# Patient Record
Sex: Male | Born: 1950 | Race: White | Hispanic: No | Marital: Married | Smoking: Former smoker
Health system: Southern US, Community
[De-identification: ages and names within clinical notes are randomized; demographics above are authoritative.]

## PROBLEM LIST (undated history)

## (undated) DIAGNOSIS — M1990 Unspecified osteoarthritis, unspecified site: Secondary | ICD-10-CM

## (undated) DIAGNOSIS — Z9289 Personal history of other medical treatment: Secondary | ICD-10-CM

## (undated) DIAGNOSIS — M109 Gout, unspecified: Secondary | ICD-10-CM

## (undated) DIAGNOSIS — Z87442 Personal history of urinary calculi: Secondary | ICD-10-CM

## (undated) DIAGNOSIS — F419 Anxiety disorder, unspecified: Secondary | ICD-10-CM

## (undated) DIAGNOSIS — I10 Essential (primary) hypertension: Secondary | ICD-10-CM

## (undated) HISTORY — PX: TONSILLECTOMY: SUR1361

## (undated) HISTORY — PX: BACK SURGERY: SHX140

---

## 2004-08-05 DIAGNOSIS — Z9289 Personal history of other medical treatment: Secondary | ICD-10-CM

## 2004-08-05 HISTORY — DX: Personal history of other medical treatment: Z92.89

## 2006-06-21 ENCOUNTER — Encounter: Admission: RE | Admit: 2006-06-21 | Discharge: 2006-06-21 | Payer: Self-pay | Admitting: Neurosurgery

## 2006-08-13 ENCOUNTER — Ambulatory Visit (HOSPITAL_COMMUNITY): Admission: RE | Admit: 2006-08-13 | Discharge: 2006-08-14 | Payer: Self-pay | Admitting: Neurosurgery

## 2006-09-08 ENCOUNTER — Encounter: Admission: RE | Admit: 2006-09-08 | Discharge: 2006-10-23 | Payer: Self-pay | Admitting: Neurosurgery

## 2007-06-22 ENCOUNTER — Encounter: Admission: RE | Admit: 2007-06-22 | Discharge: 2007-06-22 | Payer: Self-pay | Admitting: Neurosurgery

## 2007-11-02 ENCOUNTER — Encounter: Admission: RE | Admit: 2007-11-02 | Discharge: 2007-11-02 | Payer: Self-pay | Admitting: Neurosurgery

## 2007-11-18 ENCOUNTER — Ambulatory Visit (HOSPITAL_COMMUNITY): Admission: RE | Admit: 2007-11-18 | Discharge: 2007-11-19 | Payer: Self-pay | Admitting: Neurosurgery

## 2008-04-04 ENCOUNTER — Encounter: Admission: RE | Admit: 2008-04-04 | Discharge: 2008-04-04 | Payer: Self-pay | Admitting: Neurosurgery

## 2008-05-03 ENCOUNTER — Encounter: Admission: RE | Admit: 2008-05-03 | Discharge: 2008-05-03 | Payer: Self-pay | Admitting: Neurosurgery

## 2008-06-15 ENCOUNTER — Encounter: Admission: RE | Admit: 2008-06-15 | Discharge: 2008-06-15 | Payer: Self-pay | Admitting: Neurosurgery

## 2008-07-05 ENCOUNTER — Encounter: Admission: RE | Admit: 2008-07-05 | Discharge: 2008-07-05 | Payer: Self-pay | Admitting: Neurosurgery

## 2008-07-22 ENCOUNTER — Inpatient Hospital Stay (HOSPITAL_COMMUNITY): Admission: RE | Admit: 2008-07-22 | Discharge: 2008-07-26 | Payer: Self-pay | Admitting: Neurosurgery

## 2008-08-23 ENCOUNTER — Encounter: Admission: RE | Admit: 2008-08-23 | Discharge: 2008-08-23 | Payer: Self-pay | Admitting: Neurosurgery

## 2008-11-24 ENCOUNTER — Encounter: Admission: RE | Admit: 2008-11-24 | Discharge: 2008-11-24 | Payer: Self-pay | Admitting: Neurosurgery

## 2009-02-28 ENCOUNTER — Encounter: Admission: RE | Admit: 2009-02-28 | Discharge: 2009-02-28 | Payer: Self-pay | Admitting: Neurosurgery

## 2009-03-01 IMAGING — RF DG LUMBAR SPINE 2-3V
1 series · 2 of 2 positions shown · non-contrast
Comparison: CT 05/03/2008

CLINICAL DATA: L2-S1 posterior fusion.

LUMBAR SPINE - 2-3 VIEW

[Series 1: run · 2 of 2 slices shown]
[im 1/2]
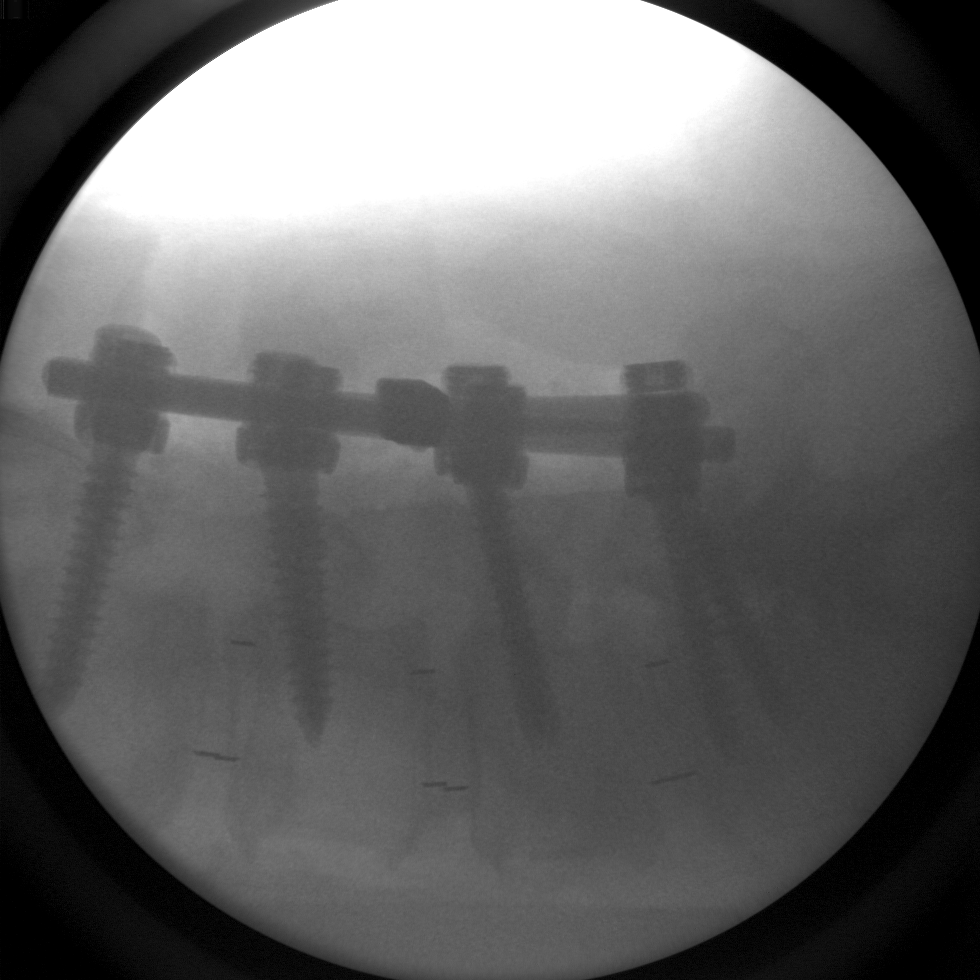
[im 2/2]
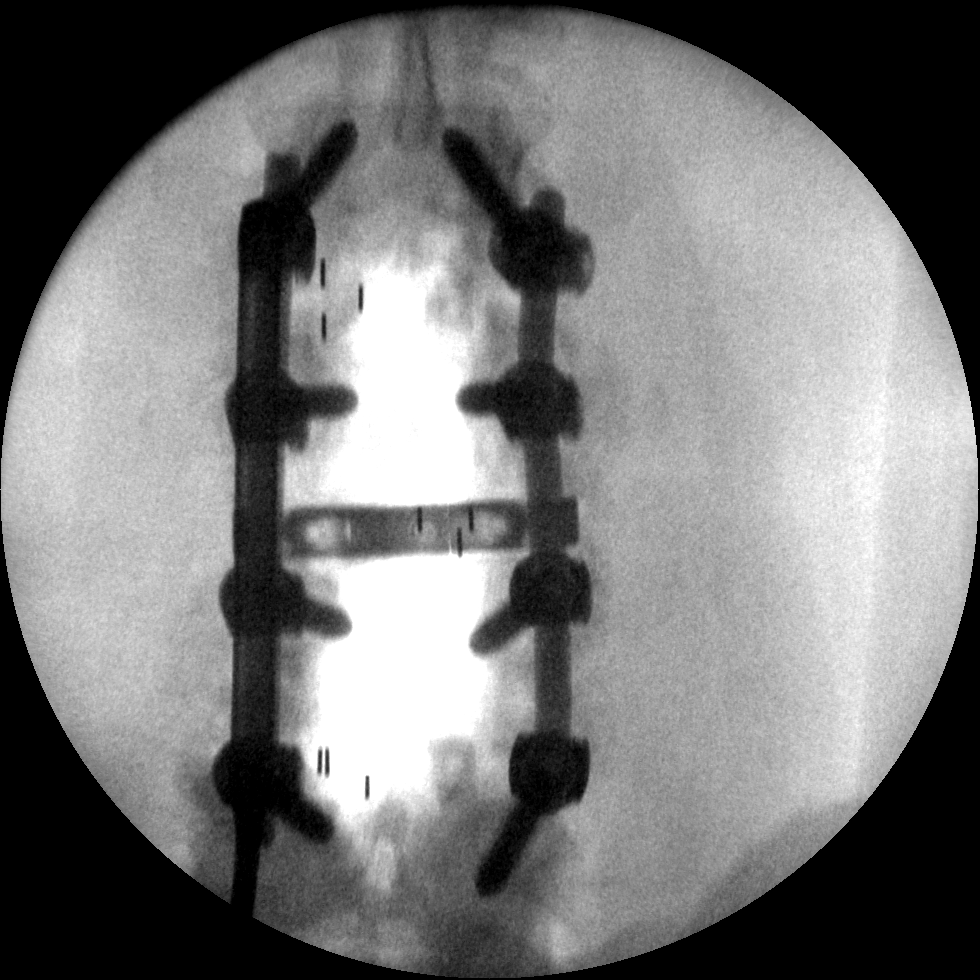

[2 of 2 positions shown; findings below may reference images not displayed]

FINDINGS: The patient is status post three-level posterior fusion
in the lumbar spine.  It is difficult to determine exact levels.
Pedicle screws appear to be an appropriate position.
IMPRESSION: Three-level posterior lumbar fusion.

## 2009-04-02 IMAGING — CR DG LUMBAR SPINE 2-3V
3 series · 3 of 3 positions shown · non-contrast
Comparison: [HOSPITAL] at [REDACTED] [HOSPITAL] lumbar spine
radiographs 05/03/2008 and [HOSPITAL] intraoperative
lumbar spine radiographs 07/22/2008.

CLINICAL DATA: Lumbar L2-L5 PLIF.  Low back pain.

LUMBAR SPINE - 2-3 VIEW

[view not recorded (1 of 3)]
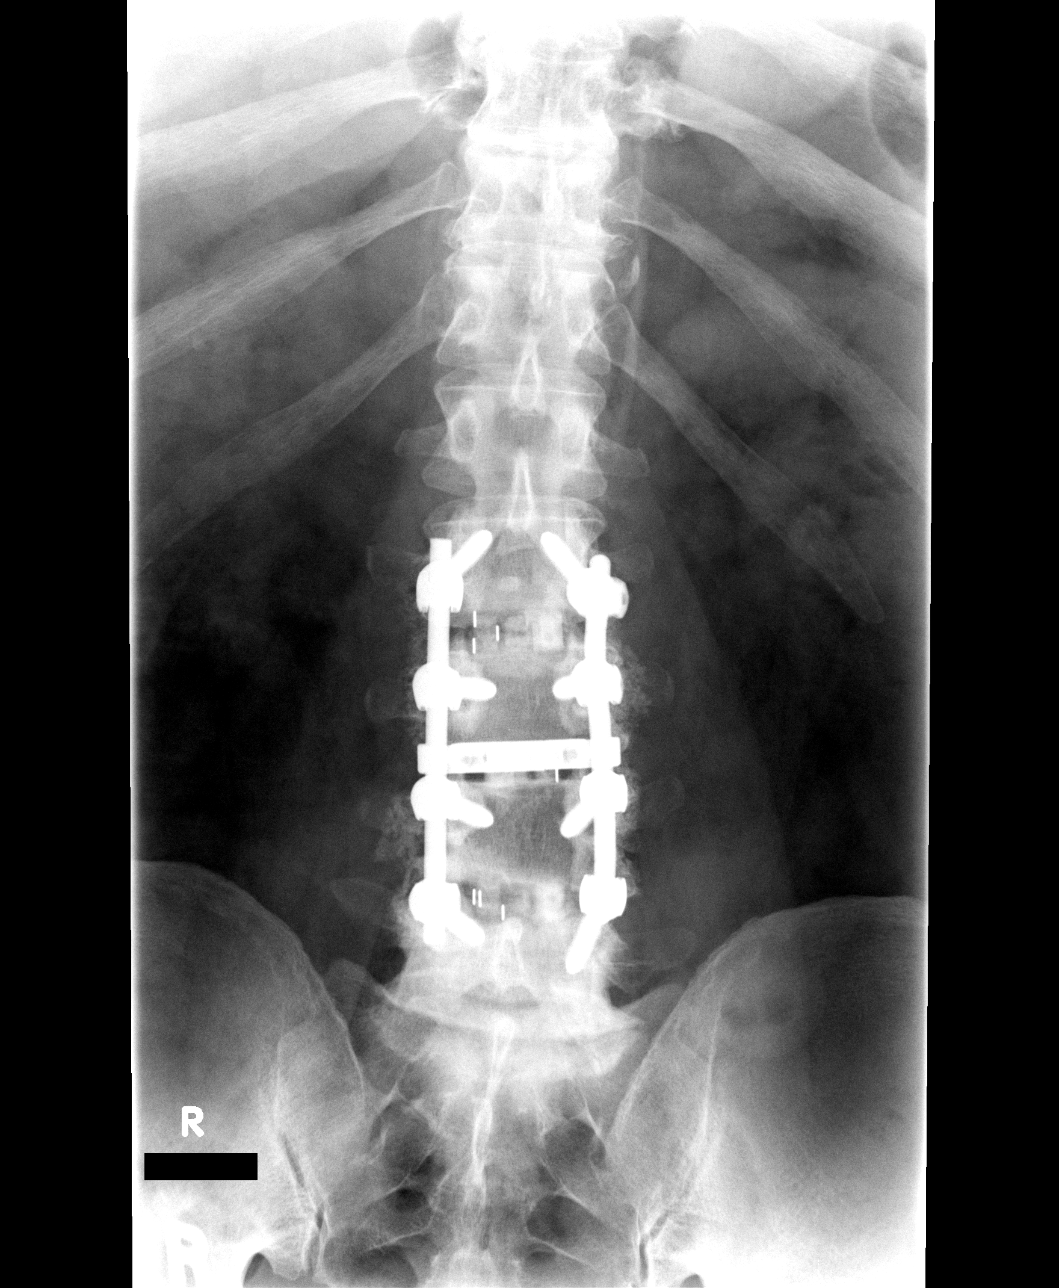

[view not recorded (2 of 3)]
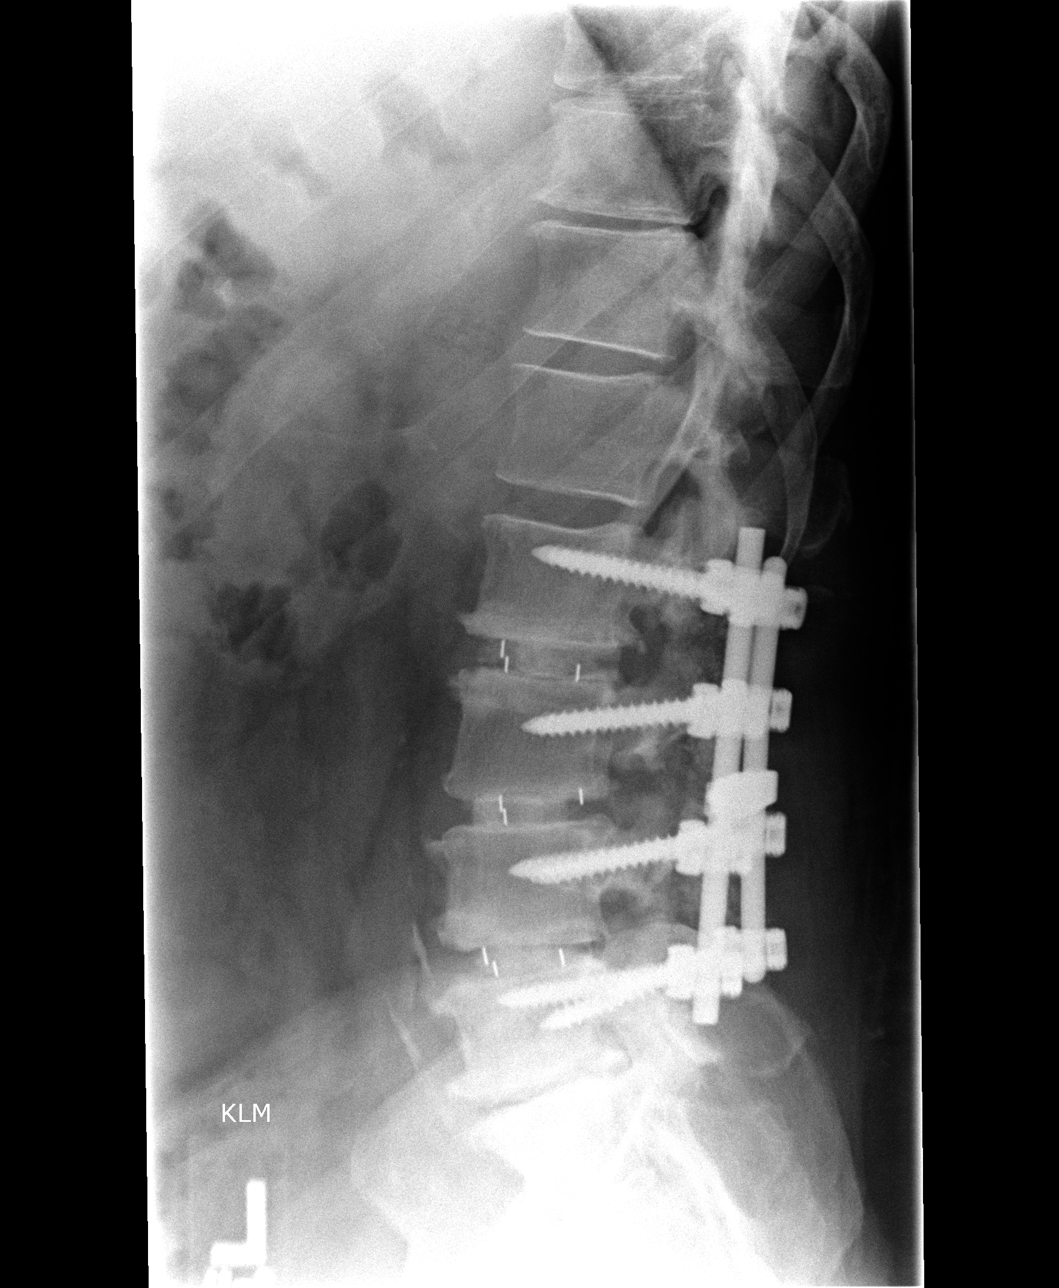

[view not recorded (3 of 3)]
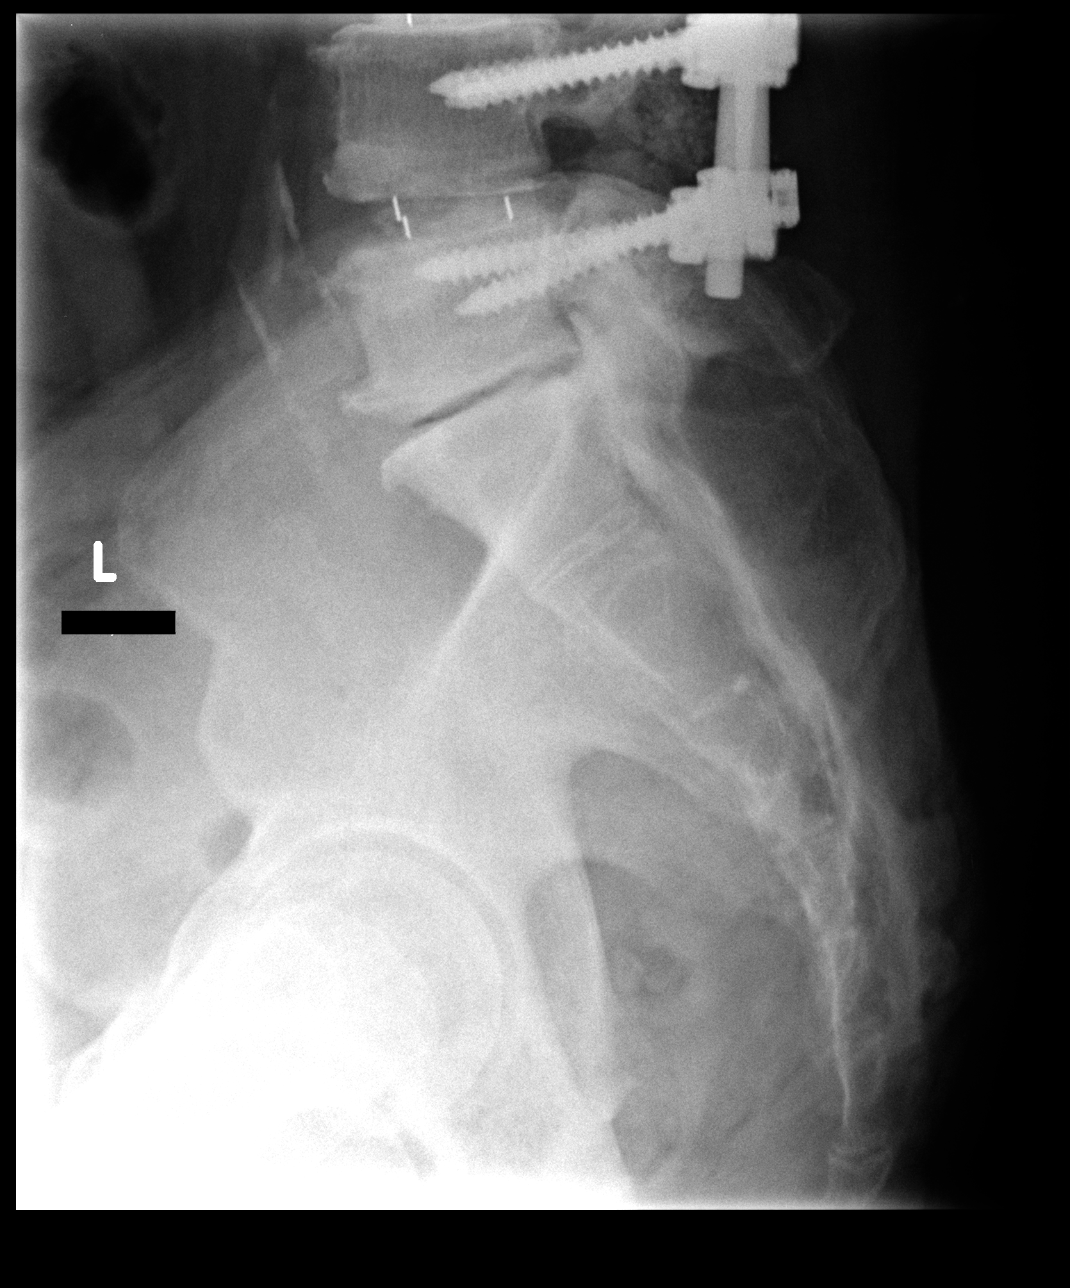

[3 of 3 positions shown; findings below may reference images not displayed]

FINDINGS: Stable posterior laminectomy interbody fusion hardware and
interbody bone plug since 07/22/2008 with satisfactory position.
The posterior vertebral alignment is normally maintained.  No
change in advanced degenerative disc disease with slight posterior
spondylosis maximal at L5-S1 and lesser posterior spondylosis L2-3.
Slight vascular calcification noted.  No acute findings seen.
IMPRESSION: 1.  No acute findings.
2.  Stable posterior laminectomy fusion L2-L5.
3.  Stable advanced degenerative disc disease with slight posterior
spondylosis maximal at L5-S1 and lesser posterior spondylosis L2-3.

## 2009-12-26 ENCOUNTER — Encounter: Admission: RE | Admit: 2009-12-26 | Discharge: 2009-12-26 | Payer: Self-pay | Admitting: Neurosurgery

## 2010-05-28 ENCOUNTER — Encounter
Admission: RE | Admit: 2010-05-28 | Discharge: 2010-07-31 | Payer: Self-pay | Source: Home / Self Care | Attending: Neurosurgery | Admitting: Neurosurgery

## 2010-12-18 NOTE — Op Note (Signed)
NAME:  Jason Kidd, Jason Kidd NO.:  [id number]   MEDICAL RECORD NO.:  [id number]          PATIENT TYPE:  OIB   LOCATION:  3599                         FACILITY:  [facility]   PHYSICIAN:  Donalee Citrin, M.D.        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   PROCEDURE:  1. Redo laminated microdiskectomy at L3-4.  2. Microscopic dissection of the right L4 nerve root.  3. Microscopic diskectomy.   SURGEON:  Donalee Citrin, MD   ASSISTANT:  Yetta Barre.   ANESTHESIA:  General endotracheal.   HISTORY OF PRESENT ILLNESS:  The patient is a very pleasant 56-year  gentleman who underwent decompressive laminectomies at 2 levels  approximately a year ago.  The patient did very well initially; however,  he said that over the last several weeks and months, progressive  worsening back and bilateral leg pain, predominantly in his hips and  legs, radiating down outside of his thighs to his knee, occasionally  just below his knee consistent with an L3-L4 nerve root pattern.  The  right leg initially was worse for him and progressed and became equally  involving the left leg.  His MRI scan showed recurrent disk herniation  encased in scar causing severe spinal stenosis on thecal sac at L3-4.  Flexion/extension films showed no sign of instability.  The patient had  predominantly claudication and leg pain.  So after failure of  conservative treatment, the patient was recommended a reexploration from  the right where the disk was eccentric to.  I went over redo diskectomy  and laminectomy.  The risks and benefits were explained to the patient  who understood and agreed to proceed forth.   The patient was brought to the OR, was induced under general anesthesia,  positioned prone  on Wilson frame.  The  back was prepped and draped in  the usual fashion.  X-ray used to  localize the appropriate level at the  superior aspect of the old incision.  The old incision was partially  opened up and ellipticized.  The scar tissue was dissected free and  subperiosteal dissection was carried over the lamina.  The residual  lamina of L3 medial facet complex down to the level of the  L3-L4  pedicle.  Intraoperative x-ray identification of the L4 pedicle.  Then  using a #4 Penfield and a #1 Penfield, the scar tissue was dissected off  of the medial border of the facet complex and the plane from the scar to  the medial facet was developed.  Then, the 2 mm Kerrison punch was used  to extend partial medial facetectomy at this level.  A high-speed drill  with a small matchstick drill bit was used to facilitate some additional  medial facetectomy, as well as the inferior aspect of the lamina.  The  residual lamina of L3 was further removed.  Then, the L4 pedicle was  identified.  There was an intensive scar tissue.  This was dissected off  the L4 pedicle and the L4 nerve root was identified and reflected  medially.  Then, attention was taken superiorly, working on extended  laminotomy up superiorly.  Native dura was found and then working from  above and below using a 4 Cytogeneticist.  The undersurface of the  dura was dissected off a very large glistening retained fragment that  was encased in scar.  It was densely adherent to the undersurface of the  dura.  It was teased away with a nerve hook and dissectors, and then  once it was confirmed that the nerve root and dura and thecal sac were  reflected medially, this was a large fragment.  This was fished out of  this compartment and the scar was dissected free and the disk space was  entered.  Annulotomy was extended and radically cleaned out.  At the end  of the diskectomy, there was no further stenosis on thecal sac or L4  nerve root.  A dilator  easily passed along the L4 foramen as well as  underneath the thecal sac all the way to the other side.  The thecal sac  was noted to be decompressed on both sides.  The undersurface of the  medial thecal sac and the left side were also easily palpated and noted  to be decompressed.  Then the wound was copiously irrigated.  Meticulous  hemostasis was maintained.  Gelfoam was laid on top of the dura.  The  muscle and fascia reapproximated in layers with interrupted Vicryl.  The  skin was closed with running 4-0 subcuticular.  Benzoin and Steri-Strips  applied.  The patient was then sent to recovery room in stable  condition.   At the end of the case, needle, instrument and sponge  counts correct.           ______________________________  Donalee Citrin, M.D.     GC/MEDQ  D:  11/18/2007  T:  11/19/2007  Job:  [id number]

## 2010-12-18 NOTE — Op Note (Signed)
NAME:  Jason Kidd, Jason Kidd NO.:  1   MEDICAL RECORD NO.:  [id number]          PATIENT TYPE:  INP   LOCATION:  3108                         FACILITY:  [facility]   PHYSICIAN:  Donalee Citrin, M.D.        DATE OF BIRTH:  08-05-1951   DATE OF PROCEDURE:  07/22/2008  DATE OF DISCHARGE:                               OPERATIVE REPORT   PREOPERATIVE DIAGNOSES:  1. Degenerative disk disease.  2. Degenerative lumbar scoliosis.  3. Lumbar spinal stenosis L2-3, L3-4, and L4-5.   PROCEDURE:  Redo decompressive lumbar laminectomies L3-4 and L4-5,  decompressive laminectomy L2-3, posterior lumbar interbody fusion L2-3,  L3-4, and L4-5 using a hybrid Telamon 8 x 22 mm PEEK allograft cage  packed with local autograft mixed with Actifuse and tangent 8 x 26 mm  allograft wedge, pedicle screw fixation L2 through L5 with 6.35 legacy  pedicle screw system, posterior arthrodesis L2-L5 using local autograft  packed with Actifuse, open reduction spinal deformity L2 through L5,  placement of a large Hemovac drain.   SURGEON:  Donalee Citrin, MD   ASSISTANT:  Reinaldo Meeker, MD   ANESTHESIA:  General endotracheal.   HISTORY OF PRESENT ILLNESS:  The patient is a very pleasant 60 year old  gentleman who has had progressive worsening back and predominant leg  pain going on for the last several months.  The patient had had previous  decompressive laminectomy several years ago.  Subsequently had a  recurrent disk herniation at L3-4, underwent redo laminectomy on that  for that and recurred again.  Over the course of the last several  months, the patient had progressive worsening neurogenic claudication  with pain that would radiate to both hips down to the front of both  quads and occasionally into the front of his shins.  The patient has had  some weakness in his quads and difficulty ambulating and pain has been  refractory to all forms of treatment with epidural steroid injections,  anti-inflammatories, narcotic pain medication.  Repeat imaging with CT  scan and lumbar MRI showed a large recurrent disk herniation at L3-4.  CT scan also showed severe degenerative scoliosis at L2-3, L3-4, and L4-  5.  Due to the patient's multiple recurrent herniations with severe  stenosis, neurogenic claudication, and previous surgery, the patient was  recommended decompression stabilization procedure and due to this  degenerative scoliosis and severe foraminal collapse on the right, the  patient was recommended L2-3, L3-4, and L4-5 decompression stabilization  with exploration of L5-S1.  Risks and benefits of the operation were  explained to the patient.  He understood and agreed to proceed forth.   PROCEDURE IN DETAIL:  The patient was brought to the OR, was induced  general anesthesia, positioned prone on the Wilson frame.  Back was  prepped in the usual sterile fashion.  Her old incision was opened up  and extended cephalocaudally.  The scar tissue was dissected free and  subperiosteal dissection was carried on the lamina of L2 and the  residual lamina of 3 down to L5.  T-piece at  L2, L3, L4, and L5 were  exposed and the L5-S1 interspace was also examined.  L5-S1 interspace  was inspected and this was felt to be partially autofused and the  patient was completely asymptomatic with a solid what appeared to be  lack of mobility at the L5-S1 disk space.  This was left alone, and  attention was taken to just doing the laminectomies at L3-4, L4-5, and  L2-3, so after adequate exposure had been obtained, the spinous process  at L2 was removed as well as a residual spinous process at L3.  The scar  tissue was dissected free and central decompression was begun at L2-3.  Complete medial facetectomies were performed to L2-3, decompressing the  L2 and L3 root and then marching through the scar tissue inferiorly both  left and right sequentially using dental dissectors, 4 Penfield, and   Kerrison rongeurs all the scar tissue was dissected free.  Complete  medial facetectomies were performed at L3-4 and L4-5.  There was a  tremendous amount of scar tissue predominantly at L3-4 on the right side  and the large recurrent disk herniation was immediately palpated.  Aggressive underbiting of the superior facet, articulating facet complex  at all levels was aggressively underbitten to obtain access to the  lateral margin of the disk space.  After the decompression had been  completed, attention was taken first to the pedicle screw placement.  Using a high-speed drill, pilot holes were drilled using external and  internal bony landmarks and fluoroscopy at each step along the way first  at L2 on the right we probed the whole of cannula with the awl probe,  tapped with a 5 x 5 tap, probed again and 6.5 x 45 screws inserted at L2  on the right.  Screws at L3, L4, and L5 were inserted in the right in  similar fashion.  All pedicles were competent in the 360-degree  orientation and fluoroscopy confirmed good positioning and trajectory.  Then working on the left side in similar fashion the L2, L3, L4, and L5  screws were all inserted.  They were all 6.5 x 45.  After all the screws  had been placed, attention taken to the interbody work first working at  L3-4, the worst of the 3 levels to reflect the left L4 nerve root  medially.  Scar tissue was dissected off the thecal sac exposing the  disk space.  Disk space was incised and a size 7 distractor was  inserted.  With a size 7 distractor in place, working on the right side  the large recurrence was medially identified.  Using a 4 Penfield Engineer, structural and a nerve hook, the scar tissue was dissected off of the  undersurface of the 4 root freeing up this large fragment and several  large fragments removed from underneath thecal sac.  This immediately  decompressed the thecal sac.  This was explored with a nerve hook  freeing up the  scar tissue and felt to be adequate with all fragments of  the disk removed.  Then after the recurrence had been removed, the  interspace was cleaned out with a size 8 cutter and chisel.  The central  disk was scraped out with an Epstein curette and a pituitary was used to  remove it.  Then, an 8 x 26 mm tangent allograft wedge was inserted in  the right side L3-4.  The distractor was removed from the left.  Disk  space was cleaned out  in a similar fashion.  Additional fragments were  removed centrally.  Local autograft was packed centrally and Telamon 10  x 8 x 22 mm PEEK cage packed with local autograft mixed with Actifuse  was inserted on the left side.  After L3-4 had been done, L4-5 was then  performed in a similar fashion.  Again extensive scar tissue  predominantly on the right was dissected free.  The disk space was  cleaned out, reamed out with a size 8 cutter and chisel.  Again  fluoroscopy used at each step along the way to confirm depth and  trajectory.  The size 8 distractor was inserted on the left side.  PEEK  cage packed with autograft and Actifuse was inserted on the right side.  Distractor was removed in a similar fashion.  Disk space was cleaned out  and the left local autograft packed centrally and left-sided tangent  inserted.  After these 2 disk spaces, then attention taken to L2-3,  working at L2-3 being very careful with retraction.  Disk space was  cleaned out bilaterally.  A size 7 distractor was inserted, this stepped  up to an 8 on the opposite side, 8 x 26 mm tangent and 8 x 22 mm Telamon  were inserted in similar fashion.  Again using a size 8 cutter and  chisel with local autograft mixed with Actifuse packed centrally.  After  all the interbody work had been placed and fluoroscopy confirmed good  position of the wedges, attention taken to copious irrigation,  aggressive decortication was carried T-piece and lateral gutters.  The  remainder of local autograft  packed posterolaterally along the T-piece  from L2 down to L5, then rods, the precut 100 was good, was appropriate  size for the left side; however, we had to cut a rod for the right, all  top tightening nuts were placed.  The L4 screw was compressed against  L5, 3 compressed against L4, and 2 compressed against L3.  Then, a 420  cross-link was inserted, all neural foramina were reinspected, they were  noted to be widely patent.  Meticulous hemostasis was maintained with  Gelfoam and Surgifoam.  Then a large Hemovac drain was placed and postop  fluoroscopy confirmed good position of the screws, rods, and bone  grafts.  Then the wound was  closed in layers with interrupted Vicryl and running forceps.  Subcuticular and skin, Dermabond, Benzoin, Steri-Strips applied.  The  patient then went to recovery room in stable condition.  At the end of  the case, instrument and sponge counts correct.           ______________________________  Donalee Citrin, M.D.     GC/MEDQ  D:  07/22/2008  T:  07/23/2008  Job:  [id number]

## 2010-12-18 NOTE — Consult Note (Signed)
NAME:  Jason Kidd, Jason Kidd NO.:  [id number]   MEDICAL RECORD NO.:  [id number]          PATIENT TYPE:  INP   LOCATION:  3013                         FACILITY:  [facility]   PHYSICIAN:  Sandria Bales. Ezzard Standing, M.D.  DATE OF BIRTH:  02/25/1951   DATE OF CONSULTATION:  07/26/2008  DATE OF DISCHARGE:  07/26/2008                                 CONSULTATION   REFERRING PHYSICIAN:  Donalee Citrin, MD   REASON FOR CONSULTATION:  Wound of chin.   HISTORY OF ILLNESS:  Mr. Welshans is a 60 year old white male who  underwent a redo laminectomy by Dr. Donalee Citrin on July 22, 2008.  The  patient was in a prone position for most of the operation and was found  to have a chin injury after a surgery.   I was consulted to evaluate his chin wound.   He has no other chronic skin conditions or problems.   ALLERGIES:  He has no allergies.   MEDICATIONS:  His admission medicines included,  1. Lipitor 40 mg daily.  2. Xanax 0.5 daily.  3. Valium 5 mg t.i.d.  4. Oxycodone 1-2 tablets for pain.  5. Diclofenac 75 mg twice a day.   His wife was in the room when I examined him.   PHYSICAL EXAMINATION:  VITAL SIGNS:  Include a temperature 98.9, pulse  of 89, and blood pressure 137/87.  GENERAL:  He is a well-nourished pleasant male.  He is lying on the bed.  HEENT:  His pupils were equal and reactive to light.  He has extraocular movements good x6.  He has no obvious nose injury.  His teeth appear intact.  His lower lip has no obvious injury, but over  his chin a little to the left side, he has an approximate 1.5 x 4.5 cm  apparent skin injury.  The area directly over the wound is numb to touch  though the surrounding area on the edges of the wound appears to have  sensation.  NECK:  Supple.  I felt no mass.  He has no thyromegaly.  No other  injury.   His labs that I have and these were admission labs, so they are dated  the July 22, 2008, is his sodium 140, potassium 4.2, chloride of  111,  BUN of 20, creatinine of 1.1.  His hemoglobin was 13.3 and  hematocrit 39.4.  His white blood count was 13,900.   IMPRESSION:  1. Mr. Bouldin has a 1.5 x 4.5 cm left chin injury most likely from      pressure or rubbing during the prone position of his recent      surgery.  It is unclear at this time whether this is a full      thickness injury or not.  I think the safest thing I talked to his      wife is to wash this 3 times a day with soap and water.  I gave him      a prescription for Silvadene to put over this and to keep it clean      and to see  how this does.  I told him it may it take a good 4-6      weeks for it to clear itself, and if there is a full-thickness      injury, it may need to be debrided or have a revision of the scar.      This can be done actually by Plastics or ENT in addition to one of      [place] in our [facility].  2. Status post redo decompressive laminectomy on July 22, 2008.  3. History of hypercholesteremia, Lipitor.  4. History of anxiety.      Sandria Bales. Ezzard Standing, M.D.  Electronically Signed     DHN/MEDQ  D:  07/26/2008  T:  07/27/2008  Job:  [id number]   cc:   Donalee Citrin, M.D.

## 2010-12-21 NOTE — Op Note (Signed)
NAME:  Jason Kidd, Jason Kidd NO.:  [id number]   MEDICAL RECORD NO.:  [id number]          PATIENT TYPE:  AMB   LOCATION:  SDS                          FACILITY:  [facility]   PHYSICIAN:  Donalee Citrin, M.D.        DATE OF BIRTH:  15-Apr-1951   DATE OF PROCEDURE:  08/13/2006  DATE OF DISCHARGE:                               OPERATIVE REPORT   PREOPERATIVE DIAGNOSIS:  Lumbar spinal stenosis, L3-4, L4-5.   POSTOPERATIVE DIAGNOSIS:  Lumbar spinal stenosis, L3-4, L4-5.   PROCEDURES:  Decompressive lumbar laminectomy, L3-4, L4-L5, with  foraminotomies of the L3, L4 and L5 nerve roots, with microscopic  diskectomy of L3-4 on the right and microscopic dissection of the right  L4 nerve root.   SURGEON:  Donalee Citrin, M.D.   ASSISTANT:  Tia Alert, MD.   ANESTHESIA:  General endotracheal.   HISTORY OF PRESENT ILLNESS:  The patient is a very pleasant 60 year old  gentleman, who has had longstanding bilateral hip and leg pain with  walking for more than half a block to a block.  The patient's symptoms  of neurogenic claudication progressed.  He failed all forms of  conservative treatment with anti-inflammatories, epidural steroid  injections, time and physical therapy.  The patient's MRI scan showed  severe lumbar spinal stenosis at multiple levels, worse at 3-4 and 4-5,  with critical stenosis and biforaminal stenosis of the L3, 4 and 5 nerve  roots.  Due to the patient's failure of conservative treatment and  history and MRI findings, he was recommended laminectomy and diskectomy.  The risks and benefits of the operation were explained to the patient,  who understands and agrees to proceed forward.   DESCRIPTION OF PROCEDURES:  The patient was brought to the OR and was  induced under general anesthesia and was positioned prone on the Jason Kidd  frame.  The back was prepped and draped in the usual sterile fashion.  Preoperative x-ray localized the L3-4 disk space.  So, after  injection  of 10 mL of lidocaine with epi, Bovie electrocautery was used to take it  down through the subcutaneous tissues.  Subperiosteal dissection was  carried out on the lamina of L3, 4 and L5 bilaterally.  Self-retaining  retractors were placed.  Intraoperative x-ray confirmed localization of  the L3-4 level.  So, the spinous processes of L3 and L4 were removed,  and the superior aspect of L5 was removed.  Then, using a 3-mm Kerrison  punch, central decompression was begun.  There was noted to be marked  stenosis from severe facet hypertrophy, causing outward  compression of  the thecal sac.  This was all teased away, and the dural plane  underneath the ligamentum flavum, which was markedly hypertrophied, was  developed with a 4 Penfield.  Then using a 3 and 4-mm Kerrison punch,  working cephalocaudally, the undersurface of the lateral gutter on the  right at L3-4 and L4-5 was underbitten, identifying both the 3, 4 and 5  nerve roots.  Noted marked facet arthropathy resulted in causing severe  compression of the dorsal aspect of each  one of those nerve roots.  This  was all teased away with a 4 Penfield, underbitten with a 2 and 3-mm  Kerrison punch, decompressing all 3 foramina on that side.  This  procedure was then repeated on the left side, decompressing the 3, 4 and  5 roots on the left. The MRI had showed extensive spondylytic and  possible disk herniations at both 3-4 and 4-5, so the disks were  inspected.  The right-sided disk at L3-4 was felt to be compressive, so  this was incised with an 11-blade scalpel.  First off, the thecal sac  and the right L4 nerve root were noted to be markedly and densely  adherent to the disk space.  This was teased away with a 4 Penfield and  reflected with a D'Errico.  Annulotomy was made.  Several fragments of  disk were removed, decompressing the right side of the thecal sac and L4  nerve root.  There was noted to be some spondylytic  compression on the  inferior aspect of the endplate of L3; however, this extended centrally  and was calcified, and it was felt to be nonsymptomatic.  Due to the  patient's symptoms of claudication, it was felt to be left alone.  The  L4 neural foramina were widely patent at this point.  There was a small  little partial split-thickness tear on the dura that was not leaking CSF  on the undersurface of the 4 roots, and this was packed away.  Attention  was then taken to the 4-5 disk space, and it was inspected on the left  side and felt to be markedly spondylytic without frank herniation; so it  was left alone.  The foramina were reexplored with a coronary dilator  and a hockey stick and noted to be widely patent.  The wound was then  copiously irrigated and meticulous hemostasis was maintained.  A small  piece of Duragen was overlaid over the dural defect in the lateral  gutter on the right side of L3-4.  Then, Gelfoam was overlaid on top of  the dura.  A medium Hemovac drain was placed.  The muscle and the fascia  were reapproximated with interrupted Vicryls, and the skin was closed  with a running, subcuticular.  Benzoin and Steri-Strips were applied.  The patient went to the recovery room in stable condition.  At the end  of the procedure, sponge counts were reported as correct.           ______________________________  Donalee Citrin, M.D.     GC/MEDQ  D:  08/13/2006  T:  08/13/2006  Job:  [id number]   cc:   Tia Alert, MD

## 2010-12-21 NOTE — Discharge Summary (Signed)
NAME:  Jason Kidd, Jason Kidd NO.:  [id number]   MEDICAL RECORD NO.:  [id number]          PATIENT TYPE:  INP   LOCATION:  3013                         FACILITY:  [facility]   PHYSICIAN:  Donalee Citrin, M.D.        DATE OF BIRTH:  June 06, 1951   DATE OF ADMISSION:  07/22/2008  DATE OF DISCHARGE:  07/26/2008                               DISCHARGE SUMMARY   ADMITTING DIAGNOSES:  1. Lumbar spinal stenosis.  2. Degenerative scoliosis.   PROCEDURES DURING THIS HOSPITALIZATION:  Decompressive lumbar  laminectomy and posterior lumbar interbody fusion at L2-3, L3-4, and L4-  5.   SURGEON:  Donalee Citrin, M.D.   ASSISTANT:  Reinaldo Meeker, M.D.   HOSPITAL COURSE:  The patient was admitted as an EMA, went to the  operating room and underwent the aforementioned procedure.  Postop, the  patient did very well in recovery room and then went to the step-down  unit due to the length of surgery and blood loss.  Postoperatively, the  patient was doing very well, had some significant low back pain;  however, complete resolution of his preoperative leg pain.  He also had  what appeared to be a burn reaction, a friction burn on the bottom of  his chin from positioning in the OR.  This was kept dressed.  The  patient was observed over the next 24-48 hours.  The patient slowly, but  progressively mobilized.  His pain became under much better control.  By  hospital day 4, the patient was stable to be discharged home, was  tolerating p.o. pain medication, was ambulating, voiding spontaneously.  Both Wound Care as well as General Surgery came and evaluated the  friction burn on his chin and recommended Silvadene cream and  progressive dressing changes as well as I placed the patient on an oral  antibiotic upon discharge.  The patient will have fixed scheduled  followup in approximately 1 week.            ______________________________  Donalee Citrin, M.D.     GC/MEDQ  D:  08/31/2008  T:  09/01/2008   Job:  [id number]

## 2011-03-05 ENCOUNTER — Other Ambulatory Visit: Payer: Self-pay | Admitting: Neurosurgery

## 2011-03-05 DIAGNOSIS — M545 Low back pain: Secondary | ICD-10-CM

## 2011-03-06 ENCOUNTER — Ambulatory Visit
Admission: RE | Admit: 2011-03-06 | Discharge: 2011-03-06 | Disposition: A | Discharge status: Home / Self Care | Payer: BC Managed Care – PPO | Source: Ambulatory Visit | Attending: Neurosurgery | Admitting: Neurosurgery

## 2011-03-06 ENCOUNTER — Other Ambulatory Visit: Payer: Self-pay | Admitting: Neurosurgery

## 2011-03-06 DIAGNOSIS — M545 Low back pain, unspecified: Secondary | ICD-10-CM

## 2011-03-06 MED ORDER — IOHEXOL 180 MG/ML  SOLN
1.0000 mL | Freq: Once | INTRAMUSCULAR | Status: AC | PRN
  Administered 2011-03-06: 1 mL via INTRA_ARTICULAR

## 2011-03-06 MED ORDER — METHYLPREDNISOLONE ACETATE 40 MG/ML INJ SUSP (RADIOLOG
120.0000 mg | Freq: Once | INTRAMUSCULAR | Status: AC
  Administered 2011-03-06: 120 mg via INTRA_ARTICULAR

## 2011-04-30 LAB — CBC
HCT: 47
MCHC: 34
MCV: 96.6
Platelets: 195
RDW: 13.2
WBC: 6.6

## 2011-05-10 LAB — CBC
HCT: 39.4 % (ref 39.0–52.0)
HCT: 48.4 % (ref 39.0–52.0)
Hemoglobin: 13.3 g/dL (ref 13.0–17.0)
Hemoglobin: 16.3 g/dL (ref 13.0–17.0)
MCHC: 33.7 g/dL (ref 30.0–36.0)
MCHC: 33.7 g/dL (ref 30.0–36.0)
MCV: 100.5 fL — ABNORMAL HIGH (ref 78.0–100.0)
RBC: 3.93 MIL/uL — ABNORMAL LOW (ref 4.22–5.81)
RDW: 13.8 % (ref 11.5–15.5)
RDW: 14 % (ref 11.5–15.5)

## 2011-05-10 LAB — DIFFERENTIAL
Basophils Relative: 0 % (ref 0–1)
Eosinophils Absolute: 0 10*3/uL (ref 0.0–0.7)
Eosinophils Absolute: 0.1 10*3/uL (ref 0.0–0.7)
Eosinophils Relative: 0 % (ref 0–5)
Lymphs Abs: 2.1 10*3/uL (ref 0.7–4.0)
Monocytes Absolute: 0.3 10*3/uL (ref 0.1–1.0)
Monocytes Relative: 2 % — ABNORMAL LOW (ref 3–12)
Monocytes Relative: 4 % (ref 3–12)
Neutro Abs: 12.5 10*3/uL — ABNORMAL HIGH (ref 1.7–7.7)
Neutrophils Relative %: 64 % (ref 43–77)

## 2011-05-10 LAB — BASIC METABOLIC PANEL
BUN: 19 mg/dL (ref 6–23)
CO2: 23 mEq/L (ref 19–32)
Calcium: 9.6 mg/dL (ref 8.4–10.5)
Chloride: 111 mEq/L (ref 96–112)
Creatinine, Ser: 0.98 mg/dL (ref 0.4–1.5)
GFR calc Af Amer: >60 mL/min (ref >60)
GFR calc Af Amer: >60 mL/min (ref >60)
GFR calc non Af Amer: >60 mL/min (ref >60)
Glucose, Bld: 175 mg/dL — ABNORMAL HIGH (ref 70–99)
Potassium: 4.2 mEq/L (ref 3.5–5.1)
Sodium: 140 mEq/L (ref 135–145)

## 2011-05-10 LAB — POCT I-STAT 7, (LYTES, BLD GAS, ICA,H+H)
Acid-Base Excess: 1 mmol/L (ref 0.0–2.0)
Bicarbonate: 23.7 mEq/L (ref 20.0–24.0)
O2 Saturation: 99 %
Sodium: 140 mEq/L (ref 135–145)
pCO2 arterial: 31.8 mmHg — ABNORMAL LOW (ref 35.0–45.0)
pH, Arterial: 7.479 — ABNORMAL HIGH (ref 7.350–7.450)

## 2011-05-10 LAB — TYPE AND SCREEN: Antibody Screen: NEGATIVE

## 2011-07-09 ENCOUNTER — Other Ambulatory Visit: Payer: Self-pay | Admitting: Neurosurgery

## 2011-07-09 DIAGNOSIS — M545 Low back pain: Secondary | ICD-10-CM

## 2011-07-10 ENCOUNTER — Ambulatory Visit
Admission: RE | Admit: 2011-07-10 | Discharge: 2011-07-10 | Disposition: A | Discharge status: Home / Self Care | Payer: BC Managed Care – PPO | Source: Ambulatory Visit | Attending: Neurosurgery | Admitting: Neurosurgery

## 2011-07-10 DIAGNOSIS — M545 Low back pain: Secondary | ICD-10-CM

## 2011-07-15 ENCOUNTER — Other Ambulatory Visit: Payer: Self-pay | Admitting: Neurosurgery

## 2011-07-15 DIAGNOSIS — M545 Low back pain: Secondary | ICD-10-CM

## 2011-07-16 ENCOUNTER — Other Ambulatory Visit: Payer: Self-pay | Admitting: Neurosurgery

## 2011-07-16 DIAGNOSIS — M545 Low back pain, unspecified: Secondary | ICD-10-CM

## 2011-07-22 ENCOUNTER — Ambulatory Visit
Admission: RE | Admit: 2011-07-22 | Discharge: 2011-07-22 | Disposition: A | Discharge status: Home / Self Care | Payer: BC Managed Care – PPO | Source: Ambulatory Visit | Attending: Neurosurgery | Admitting: Neurosurgery

## 2011-07-22 DIAGNOSIS — M545 Low back pain, unspecified: Secondary | ICD-10-CM

## 2011-07-22 MED ORDER — GADOBENATE DIMEGLUMINE 529 MG/ML IV SOLN
20.0000 mL | Freq: Once | INTRAVENOUS | Status: AC | PRN
  Administered 2011-07-22: 20 mL via INTRAVENOUS

## 2011-07-22 MED ORDER — METHYLPREDNISOLONE ACETATE 40 MG/ML INJ SUSP (RADIOLOG: 120.0000 mg | Freq: Once | INTRAMUSCULAR | Status: DC

## 2011-07-22 MED ORDER — IOHEXOL 180 MG/ML  SOLN: 1.0000 mL | Freq: Once | INTRAMUSCULAR | Status: AC | PRN

## 2011-07-22 NOTE — Patient Instructions (Signed)

## 2011-10-09 ENCOUNTER — Other Ambulatory Visit: Payer: Self-pay | Admitting: Neurosurgery

## 2011-10-09 DIAGNOSIS — M545 Low back pain: Secondary | ICD-10-CM

## 2011-10-17 ENCOUNTER — Ambulatory Visit
Admission: RE | Admit: 2011-10-17 | Discharge: 2011-10-17 | Disposition: A | Discharge status: Home / Self Care | Payer: BC Managed Care – PPO | Source: Ambulatory Visit | Attending: Neurosurgery | Admitting: Neurosurgery

## 2011-10-17 DIAGNOSIS — M545 Low back pain: Secondary | ICD-10-CM

## 2011-10-17 MED ORDER — IOHEXOL 180 MG/ML  SOLN
1.0000 mL | Freq: Once | INTRAMUSCULAR | Status: AC | PRN
  Administered 2011-10-17: 1 mL via EPIDURAL

## 2011-10-17 MED ORDER — METHYLPREDNISOLONE ACETATE 40 MG/ML INJ SUSP (RADIOLOG
120.0000 mg | Freq: Once | INTRAMUSCULAR | Status: AC
  Administered 2011-10-17: 120 mg via EPIDURAL

## 2011-10-17 NOTE — Discharge Instructions (Signed)

## 2012-01-22 ENCOUNTER — Other Ambulatory Visit: Payer: Self-pay | Admitting: Physician Assistant

## 2012-01-22 DIAGNOSIS — M545 Low back pain, unspecified: Secondary | ICD-10-CM

## 2012-01-24 ENCOUNTER — Ambulatory Visit
Admission: RE | Admit: 2012-01-24 | Discharge: 2012-01-24 | Disposition: A | Discharge status: Home / Self Care | Payer: BC Managed Care – PPO | Source: Ambulatory Visit | Attending: Physician Assistant | Admitting: Physician Assistant

## 2012-01-24 DIAGNOSIS — M545 Low back pain: Secondary | ICD-10-CM

## 2012-01-24 MED ORDER — IOHEXOL 300 MG/ML  SOLN
100.0000 mL | Freq: Once | INTRAMUSCULAR | Status: AC | PRN
  Administered 2012-01-24: 100 mL via INTRAVENOUS

## 2014-12-27 ENCOUNTER — Encounter: Payer: Self-pay | Admitting: Physical Therapy

## 2014-12-27 ENCOUNTER — Ambulatory Visit: Payer: BC Managed Care – PPO | Attending: Specialist | Admitting: Physical Therapy

## 2014-12-27 DIAGNOSIS — M25512 Pain in left shoulder: Secondary | ICD-10-CM | POA: Diagnosis not present

## 2014-12-27 DIAGNOSIS — M25612 Stiffness of left shoulder, not elsewhere classified: Secondary | ICD-10-CM

## 2014-12-27 NOTE — Therapy (Signed)
[facility] [place] [facility] [address] [place], [place], [address] Phone: [phone]   Fax:  [phone]  Physical Therapy Evaluation  Patient Details  Name: Jason Kidd MRN: [id number] Date of Birth: 06-Sep-1950 Referring Provider:  Eugenia Mcalpine, MD  Encounter Date: 12/27/2014      PT End of Session - 12/27/14 1420    Visit Number 1   Date for PT Re-Evaluation 02/26/15   PT Start Time 1335   PT Stop Time 1424   PT Time Calculation (min) 49 min      History reviewed. No pertinent past medical history.  History reviewed. No pertinent past surgical history.  There were no vitals filed for this visit.  Visit Diagnosis:  Left shoulder pain - Plan: PT plan of care cert/re-cert  Decreased ROM of left shoulder - Plan: PT plan of care cert/re-cert      Subjective Assessment - 12/27/14 1355    Subjective Patient reports that he has had some pain in the left shoulder for about two years.  He thinks it was a combination of movements reaching out and behind him.  Had a cortisone injection that helped about a year.  MRI showed an incomplete tear in the left RC mms   Limitations Lifting;House hold activities   Diagnostic tests MRI   Patient Stated Goals less pain and better motions   Currently in Pain? Yes   Pain Score 4    Pain Location Shoulder   Pain Orientation Left   Pain Descriptors / Indicators Aching   Pain Type Chronic pain   Pain Onset More than a month ago   Pain Frequency Intermittent   Aggravating Factors  prior to shot any motion of the left shoulder pain was up to 8-9/10   Pain Relieving Factors rest with arm at side, pain could be a 0/10            [facility] PT Assessment - 12/27/14 0001    Assessment   Medical Diagnosis incomplete tear of the left RC mms   Onset Date/Surgical Date 12/26/12   Prior Therapy for back after surgery   Precautions   Precautions None   Precaution Comments has had lumbar  fusion in the past   Balance Screen   Has the patient fallen in the past 6 months No   Has the patient had a decrease in activity level because of a fear of falling?  No   Is the patient reluctant to leave their home because of a fear of falling?  No   Home Environment   Additional Comments does some housework, would love to play golf but the back has limited him   Prior Function   Level of Independence Independent   Vocation Part time employment   Vocation Requirements a lot of sitting   Leisure walk   Posture/Postural Control   Posture Comments fwd head and rounded shoulders   AROM   Left Shoulder Flexion 150 Degrees   Left Shoulder ABduction 150 Degrees   Left Shoulder Internal Rotation 55 Degrees   Left Shoulder External Rotation 75 Degrees   Strength   Overall Strength Comments 4/5   Palpation   Palpation comment tight with spasms in the upper traps and neck, + empty can test                            PT Education - 12/27/14 1420  Education provided Yes   Education Details HEP with tband for scapular stabilization and ER strength   Person(s) Educated Patient   Methods Explanation;Demonstration;Handout   Comprehension Verbalized understanding;Verbal cues required          PT Short Term Goals - 12/27/14 1423    PT SHORT TERM GOAL #1   Title independent iwth initial HEP   Time 2   Period Weeks   Status New           PT Long Term Goals - 12/27/14 1423    PT LONG TERM GOAL #1   Title decrease pain 50%   Time 8   Period Weeks   Status New   PT LONG TERM GOAL #2   Title increase left shoulder IR to 70 degrees   Time 8   Period Weeks   Status New   PT LONG TERM GOAL #3   Title be able to hold proper posture for 10 minutes               Plan - 12/27/14 1421    Clinical Impression Statement Patient with left partial RC tear, had a cortisone injection last week with great relief of pain.  Some limitation in IR, weak scapular  mms.  Poor posture   Pt will benefit from skilled therapeutic intervention in order to improve on the following deficits Decreased range of motion;Decreased strength;Increased muscle spasms;Impaired UE functional use;Pain   Rehab Potential Good   PT Frequency 2x / week   PT Duration 8 weeks   PT Treatment/Interventions Electrical Stimulation;Cryotherapy;Iontophoresis 4mg /ml Dexamethasone;Moist Heat;Ultrasound;Therapeutic exercise;Patient/family education;Manual techniques   PT Next Visit Plan He is to try the HEP over the next two weeks and then return, we will increase scapualr and RC strength ex's at that time if no pain   Consulted and Agree with Plan of Care Patient         Problem List There are no active problems to display for this patient.   Jearld LeschALBRIGHT,Leomar Westberg W., PT 12/27/2014, 2:27 PM  [facility] [address] [place], [place], [address] Phone: [phone]   Fax:  [phone]

## 2015-01-09 ENCOUNTER — Ambulatory Visit: Payer: BC Managed Care – PPO | Attending: Specialist | Admitting: Physical Therapy

## 2015-01-09 ENCOUNTER — Encounter: Payer: Self-pay | Admitting: Physical Therapy

## 2015-01-09 DIAGNOSIS — M25512 Pain in left shoulder: Secondary | ICD-10-CM | POA: Diagnosis present

## 2015-01-09 DIAGNOSIS — M7582 Other shoulder lesions, left shoulder: Secondary | ICD-10-CM | POA: Diagnosis present

## 2015-01-09 DIAGNOSIS — M25612 Stiffness of left shoulder, not elsewhere classified: Secondary | ICD-10-CM

## 2015-01-09 NOTE — Therapy (Signed)
[facility] [place] [facility] [address] [place], [place], [address] Phone: [phone]   Fax:  [phone]  Physical Therapy Treatment  Patient Details  Name: Jason Kidd MRN: [id number] Date of Birth: 1951-07-24 Referring Provider:  Eugenia Mcalpine, MD  Encounter Date: 01/09/2015      PT End of Session - 01/09/15 1519    Visit Number 2   PT Start Time 1404   PT Stop Time 1500   PT Time Calculation (min) 56 min   Activity Tolerance Patient tolerated treatment well      History reviewed. No pertinent past medical history.  History reviewed. No pertinent past surgical history.  There were no vitals filed for this visit.  Visit Diagnosis:  Left shoulder pain  Decreased ROM of left shoulder      Subjective Assessment - 01/09/15 1405    Subjective Reports that the red tband with ER caused pain right away.  When he stopped the exercise his pain would stop.   Currently in Pain? Yes   Pain Score 4    Pain Location Shoulder   Pain Orientation Left   Pain Descriptors / Indicators Aching                         [facility] Adult PT Treatment/Exercise - 01/09/15 0001    Shoulder Exercises: ROM/Strengthening   UBE (Upper Arm Bike) Level 3 x 3 minutes   "W" Arms 2#   Other ROM/Strengthening Exercises seated row 20#, lats 20#   Other ROM/Strengthening Exercises weighted ball rhythmic stabilization, isometric ER, overhead weighted ball carry   Electrical Stimulation   Electrical Stimulation Location left shoulder   Electrical Stimulation Action IFC   Electrical Stimulation Parameters to tolerance   Electrical Stimulation Goals Pain   Iontophoresis   Type of Iontophoresis Dexamethasone   Location left shoulder   Dose 80mA   Time 4 hour patch                PT Education - 01/09/15 1519    Education provided Yes   Education Details added isometrics and had him take away the ER with red tband   Person(s) Educated Patient   Methods Explanation;Demonstration;Handout   Comprehension Verbalized understanding          PT Short Term Goals - 12/27/14 1423    PT SHORT TERM GOAL #1   Title independent iwth initial HEP   Time 2   Period Weeks   Status New           PT Long Term Goals - 12/27/14 1423    PT LONG TERM GOAL #1   Title decrease pain 50%   Time 8   Period Weeks   Status New   PT LONG TERM GOAL #2   Title increase left shoulder IR to 70 degrees   Time 8   Period Weeks   Status New   PT LONG TERM GOAL #3   Title be able to hold proper posture for 10 minutes               Plan - 01/09/15 1520    Clinical Impression Statement Had increasec pain iwth active resisted ER, minimal pain with isometric ER   PT Next Visit Plan see if ionto helped   Consulted and Agree with Plan of Care Patient        Problem List There are no active problems to display for this  patient.   Jearld LeschALBRIGHT,MICHAEL W., PT 01/09/2015, 3:21 PM  [facility] [address] [place], [place], [address] Phone: [phone]   Fax:  [phone]

## 2015-01-17 ENCOUNTER — Ambulatory Visit: Payer: BC Managed Care – PPO | Admitting: Physical Therapy

## 2015-01-17 ENCOUNTER — Encounter: Payer: Self-pay | Admitting: Physical Therapy

## 2015-01-17 DIAGNOSIS — M25512 Pain in left shoulder: Secondary | ICD-10-CM | POA: Diagnosis not present

## 2015-01-17 DIAGNOSIS — M25612 Stiffness of left shoulder, not elsewhere classified: Secondary | ICD-10-CM

## 2015-01-17 NOTE — Therapy (Signed)
[place], [place], [address] Phone: [phone]   Fax:  [phone]  Physical Therapy Treatment  Patient Details  Name: Jason Kidd MRN: [id number] Date of Birth: [birth year] Referring Provider:  Sydnee Cabal, MD  Encounter Date: 01/17/2015      PT End of Session - 01/17/15 1139    Visit Number 3   Date for PT Re-Evaluation 02/26/15   PT Start Time 1102   PT Stop Time 1200   PT Time Calculation (min) 58 min      History reviewed. No pertinent past medical history.  History reviewed. No pertinent past surgical history.  There were no vitals filed for this visit.  Visit Diagnosis:  Left shoulder pain  Decreased ROM of left shoulder      Subjective Assessment - 01/17/15 1120    Subjective Felt really good after the last visit.   Currently in Pain? Yes   Pain Score 1    Pain Location Shoulder   Pain Orientation Left;Anterior   Aggravating Factors  reaching and doing ER   Pain Relieving Factors last treatment really helped                         [facility] Adult PT Treatment/Exercise - 01/17/15 0001    Shoulder Exercises: ROM/Strengthening   UBE (Upper Arm Bike) Level 3 x 4 minutes   "W" Arms no weight   Rhythmic Stabilization, Supine standing with weighted ball, overhead carry   Other ROM/Strengthening Exercises seated row 20#, lats 20#   Electrical Stimulation   Electrical Stimulation Location left shoulder   Electrical Stimulation Action IFC   Electrical Stimulation Parameters toelrance   Electrical Stimulation Goals Pain   Iontophoresis   Type of Iontophoresis Dexamethasone   Location left shoulder   Dose 61mA   Time 4 hour patch                  PT Short Term Goals - 12/27/14 1423    PT SHORT TERM GOAL #1   Title independent iwth initial HEP   Time 2   Period Weeks   Status New           PT Long Term Goals - 01/17/15 1140    PT LONG TERM GOAL #1   Title decrease pain 50%   Status Partially Met   PT LONG TERM GOAL #2   Title increase left shoulder IR to 70 degrees   Status Achieved               Plan - 01/17/15 1139    Clinical Impression Statement Better with stopping the active ER, the "isometrics worked" still pain with active ER.  ROM is WFL's   PT Next Visit Plan continue over the next couple of weeks   Consulted and Agree with Plan of Care Patient        Problem List There are no active problems to display for this patient.   Sumner Boast., PT 01/17/2015, 11:41 AM  [place] [address] [place], [place], [address] Phone: [phone]   Fax:  [phone]

## 2015-04-28 ENCOUNTER — Other Ambulatory Visit: Payer: Self-pay | Admitting: Neurosurgery

## 2015-04-28 DIAGNOSIS — M5137 Other intervertebral disc degeneration, lumbosacral region: Secondary | ICD-10-CM

## 2015-05-02 ENCOUNTER — Ambulatory Visit
Admission: RE | Admit: 2015-05-02 | Discharge: 2015-05-02 | Disposition: A | Discharge status: Home / Self Care | Payer: BC Managed Care – PPO | Source: Ambulatory Visit | Attending: Neurosurgery | Admitting: Neurosurgery

## 2015-05-02 DIAGNOSIS — M5137 Other intervertebral disc degeneration, lumbosacral region: Secondary | ICD-10-CM

## 2015-06-12 ENCOUNTER — Other Ambulatory Visit (HOSPITAL_COMMUNITY): Payer: Self-pay | Admitting: Neurosurgery

## 2015-07-06 ENCOUNTER — Encounter (HOSPITAL_COMMUNITY)
Admission: RE | Admit: 2015-07-06 | Discharge: 2015-07-06 | Disposition: A | Discharge status: Home / Self Care | Payer: BC Managed Care – PPO | Source: Ambulatory Visit | Attending: Neurosurgery | Admitting: Neurosurgery

## 2015-07-06 ENCOUNTER — Encounter (HOSPITAL_COMMUNITY): Payer: Self-pay

## 2015-07-06 DIAGNOSIS — Z01812 Encounter for preprocedural laboratory examination: Secondary | ICD-10-CM | POA: Insufficient documentation

## 2015-07-06 DIAGNOSIS — Z0183 Encounter for blood typing: Secondary | ICD-10-CM | POA: Diagnosis not present

## 2015-07-06 DIAGNOSIS — M4310 Spondylolisthesis, site unspecified: Secondary | ICD-10-CM | POA: Diagnosis not present

## 2015-07-06 HISTORY — DX: Gout, unspecified: M10.9

## 2015-07-06 HISTORY — DX: Personal history of other medical treatment: Z92.89

## 2015-07-06 HISTORY — DX: Anxiety disorder, unspecified: F41.9

## 2015-07-06 HISTORY — DX: Personal history of urinary calculi: Z87.442

## 2015-07-06 HISTORY — DX: Unspecified osteoarthritis, unspecified site: M19.90

## 2015-07-06 HISTORY — DX: Essential (primary) hypertension: I10

## 2015-07-06 LAB — COMPREHENSIVE METABOLIC PANEL
ALBUMIN: 3.9 g/dL (ref 3.5–5.0)
ALK PHOS: 49 U/L (ref 38–126)
ALT: 37 U/L (ref 17–63)
AST: 30 U/L (ref 15–41)
Anion gap: 9 (ref 5–15)
BILIRUBIN TOTAL: 1.3 mg/dL — AB (ref 0.3–1.2)
BUN: 13 mg/dL (ref 6–20)
CALCIUM: 9.4 mg/dL (ref 8.9–10.3)
CO2: 25 mmol/L (ref 22–32)
Chloride: 106 mmol/L (ref 101–111)
Creatinine, Ser: 0.96 mg/dL (ref 0.61–1.24)
GFR calc Af Amer: >60 mL/min (ref >60)
GFR calc non Af Amer: >60 mL/min (ref >60)
GLUCOSE: 121 mg/dL — AB (ref 65–99)
Potassium: 3.9 mmol/L (ref 3.5–5.1)
SODIUM: 140 mmol/L (ref 135–145)
TOTAL PROTEIN: 6.7 g/dL (ref 6.5–8.1)

## 2015-07-06 LAB — CBC
HCT: 47 % (ref 39.0–52.0)
Hemoglobin: 16.4 g/dL (ref 13.0–17.0)
MCH: 33.1 pg (ref 26.0–34.0)
MCHC: 34.9 g/dL (ref 30.0–36.0)
MCV: 94.8 fL (ref 78.0–100.0)
Platelets: 156 10*3/uL (ref 150–400)
RBC: 4.96 MIL/uL (ref 4.22–5.81)
RDW: 13.7 % (ref 11.5–15.5)
WBC: 6.2 10*3/uL (ref 4.0–10.5)

## 2015-07-06 LAB — SURGICAL PCR SCREEN
MRSA, PCR: POSITIVE — AB
STAPHYLOCOCCUS AUREUS: POSITIVE — AB

## 2015-07-06 LAB — TYPE AND SCREEN
ABO/RH(D): A NEG
ANTIBODY SCREEN: NEGATIVE

## 2015-07-06 NOTE — Progress Notes (Signed)
Awaiting records fr. Dr. Lawerance BachS. Ruehle, requested EKG & last ov.

## 2015-07-06 NOTE — Progress Notes (Signed)
   07/06/15 1055  OBSTRUCTIVE SLEEP APNEA  Have you ever been diagnosed with sleep apnea through a sleep study? No  Do you snore loudly (loud enough to be heard through closed doors)?  0  Has anyone observed you stop breathing during your sleep? 0  Do you have, or are you being treated for high blood pressure? 1  BMI more than 35 kg/m2? 1  Age > 50 (1-yes) 1  Neck circumference greater than:Male 16 inches or larger, Male 17inches or larger? 1  Male Gender (Yes=1) 1  Obstructive Sleep Apnea Score 5  Score 5 or greater  Results sent to PCP

## 2015-07-06 NOTE — Pre-Procedure Instructions (Signed)
[place] WANT  07/06/2015      [facility] [address] [address], [place] - [address] AT [facility] OF [address] [place] [address] Phone: [phone] Fax: [phone]    Your procedure is scheduled on 07/12/2015.  Report to [facility] Admitting at 6:30A.M.  Call this number if you have problems the morning of surgery:  [phone]   Remember:  Do not eat food or drink liquids after midnight. Tuesday  Take these medicines the morning of surgery with A SIP OF WATER :  ALLOPURINOL   Do not wear jewelry   Do not wear lotions, powders, or perfumes.  You may wear deodorant.     Men may shave face and neck.   Do not bring valuables to the hospital.   [facility] is not responsible for any belongings or valuables.  Contacts, dentures or bridgework may not be worn into surgery.  Leave your suitcase in the car.  After surgery it may be brought to your room.  For patients admitted to the hospital, discharge time will be determined by your treatment team.  Patients discharged the day of surgery will not be allowed to drive home.   Name and phone number of your driver:   With family Special instructions:  Special Instructions: [place] - Preparing for Surgery  Before surgery, you can play an important role.  Because skin is not sterile, your skin needs to be as free of germs as possible.  You can reduce the number of germs on you skin by washing with CHG (chlorahexidine gluconate) soap before surgery.  CHG is an antiseptic cleaner which kills germs and bonds with the skin to continue killing germs even after washing.  Please DO NOT use if you have an allergy to CHG or antibacterial soaps.  If your skin becomes reddened/irritated stop using the CHG and inform your nurse when you arrive at Short Stay.  Do not shave (including legs and underarms) for at least 48 hours prior to the first CHG shower.  You may shave your  face.  Please follow these instructions carefully:   1.  Shower with CHG Soap the night before surgery and the  morning of Surgery.  2.  If you choose to wash your hair, wash your hair first as usual with your  normal shampoo.  3.  After you shampoo, rinse your hair and body thoroughly to remove the  Shampoo.  4.  Use CHG as you would any other liquid soap.  You can apply chg directly to the skin and wash gently with scrungie or a clean washcloth.  5.  Apply the CHG Soap to your body ONLY FROM THE NECK DOWN.    Do not use on open wounds or open sores.  Avoid contact with your eyes, ears, mouth and genitals (private parts).  Wash genitals (private parts)   with your normal soap.  6.  Wash thoroughly, paying special attention to the area where your surgery will be performed.  7.  Thoroughly rinse your body with warm water from the neck down.  8.  DO NOT shower/wash with your normal soap after using and rinsing off   the CHG Soap.  9.  Pat yourself dry with a clean towel.            10.  Wear clean pajamas.            11.  Place clean  sheets on your bed the night of your first shower and do not sleep with pets.  Day of Surgery  Do not apply any lotions/deodorants the morning of surgery.  Please wear clean clothes to the hospital/surgery center.   Please read over the following fact sheets that you were given. Pain Booklet, Coughing and Deep Breathing, Blood Transfusion Information, MRSA Information and Surgical Site Infection Prevention

## 2015-07-11 MED ORDER — DEXAMETHASONE SODIUM PHOSPHATE 10 MG/ML IJ SOLN
10.0000 mg | INTRAMUSCULAR | Status: DC
  Filled 2015-07-11: qty 1

## 2015-07-11 MED ORDER — DEXTROSE 5 % IV SOLN
3.0000 g | INTRAVENOUS | Status: AC
  Administered 2015-07-12: 3 g via INTRAVENOUS
  Filled 2015-07-11 (×4): qty 3000

## 2015-07-12 ENCOUNTER — Inpatient Hospital Stay (HOSPITAL_COMMUNITY)
Admission: RE | Admit: 2015-07-12 | Discharge: 2015-07-14 | DRG: 460 | Disposition: A | Discharge status: Home / Self Care | Payer: BC Managed Care – PPO | Source: Ambulatory Visit | Attending: Neurosurgery | Admitting: Neurosurgery

## 2015-07-12 ENCOUNTER — Encounter (HOSPITAL_COMMUNITY)
Admission: RE | Disposition: A | Discharge status: Home / Self Care | Payer: BC Managed Care – PPO | Source: Ambulatory Visit | Attending: Neurosurgery

## 2015-07-12 ENCOUNTER — Encounter (HOSPITAL_COMMUNITY): Payer: Self-pay | Admitting: *Deleted

## 2015-07-12 ENCOUNTER — Inpatient Hospital Stay (HOSPITAL_COMMUNITY): Payer: BC Managed Care – PPO

## 2015-07-12 ENCOUNTER — Inpatient Hospital Stay (HOSPITAL_COMMUNITY): Payer: BC Managed Care – PPO | Admitting: Certified Registered Nurse Anesthetist

## 2015-07-12 DIAGNOSIS — M109 Gout, unspecified: Secondary | ICD-10-CM | POA: Diagnosis present

## 2015-07-12 DIAGNOSIS — M19012 Primary osteoarthritis, left shoulder: Secondary | ICD-10-CM | POA: Diagnosis present

## 2015-07-12 DIAGNOSIS — M532X6 Spinal instabilities, lumbar region: Secondary | ICD-10-CM | POA: Diagnosis present

## 2015-07-12 DIAGNOSIS — M48061 Spinal stenosis, lumbar region without neurogenic claudication: Secondary | ICD-10-CM | POA: Diagnosis present

## 2015-07-12 DIAGNOSIS — M4806 Spinal stenosis, lumbar region: Secondary | ICD-10-CM | POA: Diagnosis present

## 2015-07-12 DIAGNOSIS — M549 Dorsalgia, unspecified: Secondary | ICD-10-CM | POA: Diagnosis present

## 2015-07-12 DIAGNOSIS — Z419 Encounter for procedure for purposes other than remedying health state, unspecified: Secondary | ICD-10-CM

## 2015-07-12 DIAGNOSIS — I10 Essential (primary) hypertension: Secondary | ICD-10-CM | POA: Diagnosis present

## 2015-07-12 DIAGNOSIS — Z87891 Personal history of nicotine dependence: Secondary | ICD-10-CM | POA: Diagnosis not present

## 2015-07-12 DIAGNOSIS — F419 Anxiety disorder, unspecified: Secondary | ICD-10-CM | POA: Diagnosis present

## 2015-07-12 DIAGNOSIS — M1711 Unilateral primary osteoarthritis, right knee: Secondary | ICD-10-CM | POA: Diagnosis present

## 2015-07-12 DIAGNOSIS — E669 Obesity, unspecified: Secondary | ICD-10-CM | POA: Diagnosis present

## 2015-07-12 DIAGNOSIS — Z6837 Body mass index (BMI) 37.0-37.9, adult: Secondary | ICD-10-CM

## 2015-07-12 DIAGNOSIS — M5136 Other intervertebral disc degeneration, lumbar region: Secondary | ICD-10-CM | POA: Diagnosis present

## 2015-07-12 SURGERY — POSTERIOR LUMBAR FUSION 1 LEVEL
Anesthesia: General | Site: Back

## 2015-07-12 MED ORDER — ONDANSETRON HCL 4 MG/2ML IJ SOLN
INTRAMUSCULAR | Status: AC
  Filled 2015-07-12: qty 2

## 2015-07-12 MED ORDER — SODIUM CHLORIDE 0.9 % IV SOLN: 250.0000 mL | INTRAVENOUS | Status: DC

## 2015-07-12 MED ORDER — SUCCINYLCHOLINE CHLORIDE 20 MG/ML IJ SOLN
INTRAMUSCULAR | Status: AC
  Filled 2015-07-12: qty 1

## 2015-07-12 MED ORDER — OXYCODONE-ACETAMINOPHEN 5-325 MG PO TABS
1.0000 | ORAL_TABLET | ORAL | Status: DC | PRN
  Administered 2015-07-12 – 2015-07-13 (×6): 2 via ORAL
  Filled 2015-07-12 (×6): qty 2

## 2015-07-12 MED ORDER — ONDANSETRON HCL 4 MG/2ML IJ SOLN
INTRAMUSCULAR | Status: DC | PRN
  Administered 2015-07-12: 4 mg via INTRAVENOUS

## 2015-07-12 MED ORDER — PHENYLEPHRINE 40 MCG/ML (10ML) SYRINGE FOR IV PUSH (FOR BLOOD PRESSURE SUPPORT)
PREFILLED_SYRINGE | INTRAVENOUS | Status: AC
Start: 2015-07-12 — End: 2015-07-12
  Filled 2015-07-12: qty 20

## 2015-07-12 MED ORDER — SODIUM CHLORIDE 0.9 % IR SOLN
Status: DC | PRN
  Administered 2015-07-12: 500 mL

## 2015-07-12 MED ORDER — ARTIFICIAL TEARS OP OINT
TOPICAL_OINTMENT | OPHTHALMIC | Status: DC | PRN
  Administered 2015-07-12: 1 via OPHTHALMIC

## 2015-07-12 MED ORDER — VANCOMYCIN HCL 1000 MG IV SOLR
INTRAVENOUS | Status: DC | PRN
  Administered 2015-07-12: 1000 mg via TOPICAL

## 2015-07-12 MED ORDER — MIDAZOLAM HCL 5 MG/5ML IJ SOLN
INTRAMUSCULAR | Status: DC | PRN
  Administered 2015-07-12: 2 mg via INTRAVENOUS

## 2015-07-12 MED ORDER — HYDROMORPHONE HCL 1 MG/ML IJ SOLN
INTRAMUSCULAR | Status: AC
  Filled 2015-07-12: qty 1

## 2015-07-12 MED ORDER — CLONAZEPAM 0.5 MG PO TABS
1.0000 mg | ORAL_TABLET | Freq: Every evening | ORAL | Status: DC | PRN
  Administered 2015-07-13: 1 mg via ORAL
  Filled 2015-07-12: qty 2

## 2015-07-12 MED ORDER — ATORVASTATIN CALCIUM 20 MG PO TABS
40.0000 mg | ORAL_TABLET | Freq: Every day | ORAL | Status: DC
  Administered 2015-07-12 – 2015-07-13 (×2): 40 mg via ORAL
  Filled 2015-07-12 (×2): qty 2

## 2015-07-12 MED ORDER — HYDROCHLOROTHIAZIDE 25 MG PO TABS
25.0000 mg | ORAL_TABLET | Freq: Every day | ORAL | Status: DC
  Administered 2015-07-12 – 2015-07-13 (×2): 25 mg via ORAL
  Filled 2015-07-12 (×2): qty 1

## 2015-07-12 MED ORDER — OXYCODONE HCL 5 MG PO TABS: 5.0000 mg | ORAL_TABLET | Freq: Once | ORAL | Status: DC | PRN

## 2015-07-12 MED ORDER — PHENOL 1.4 % MT LIQD
1.0000 | OROMUCOSAL | Status: DC | PRN
Start: 2015-07-12 — End: 2015-07-14

## 2015-07-12 MED ORDER — EPHEDRINE SULFATE 50 MG/ML IJ SOLN: INTRAMUSCULAR | Status: DC | PRN

## 2015-07-12 MED ORDER — LIDOCAINE-EPINEPHRINE 1 %-1:100000 IJ SOLN
INTRAMUSCULAR | Status: DC | PRN
  Administered 2015-07-12: 9 mL

## 2015-07-12 MED ORDER — SODIUM CHLORIDE 0.9 % IJ SOLN: 3.0000 mL | INTRAMUSCULAR | Status: DC | PRN

## 2015-07-12 MED ORDER — PROPOFOL 10 MG/ML IV BOLUS
INTRAVENOUS | Status: DC | PRN
  Administered 2015-07-12: 200 mg via INTRAVENOUS

## 2015-07-12 MED ORDER — CEFAZOLIN SODIUM-DEXTROSE 2-3 GM-% IV SOLR
2.0000 g | Freq: Three times a day (TID) | INTRAVENOUS | Status: AC
  Administered 2015-07-12 – 2015-07-14 (×6): 2 g via INTRAVENOUS
  Filled 2015-07-12 (×6): qty 50

## 2015-07-12 MED ORDER — BUPIVACAINE LIPOSOME 1.3 % IJ SUSP
INTRAMUSCULAR | Status: DC | PRN
  Administered 2015-07-12: 20 mL

## 2015-07-12 MED ORDER — GLUCOSAMINE CHONDROITIN JOINT PO TABS: ORAL_TABLET | Freq: Two times a day (BID) | ORAL | Status: DC

## 2015-07-12 MED ORDER — POTASSIUM CITRATE ER 15 MEQ (1620 MG) PO TBCR: 1.0000 | EXTENDED_RELEASE_TABLET | Freq: Three times a day (TID) | ORAL | Status: DC

## 2015-07-12 MED ORDER — SUGAMMADEX SODIUM 500 MG/5ML IV SOLN
INTRAVENOUS | Status: DC | PRN
  Administered 2015-07-12: 260 mg via INTRAVENOUS

## 2015-07-12 MED ORDER — BUPIVACAINE LIPOSOME 1.3 % IJ SUSP
20.0000 mL | INTRAMUSCULAR | Status: AC
  Filled 2015-07-12: qty 20

## 2015-07-12 MED ORDER — FENTANYL CITRATE (PF) 100 MCG/2ML IJ SOLN
INTRAMUSCULAR | Status: DC | PRN
  Administered 2015-07-12 (×3): 100 ug via INTRAVENOUS

## 2015-07-12 MED ORDER — ONDANSETRON HCL 4 MG/2ML IJ SOLN: 4.0000 mg | INTRAMUSCULAR | Status: DC | PRN

## 2015-07-12 MED ORDER — LIDOCAINE HCL (CARDIAC) 20 MG/ML IV SOLN
INTRAVENOUS | Status: DC | PRN
  Administered 2015-07-12: 100 mg via INTRAVENOUS

## 2015-07-12 MED ORDER — MENTHOL 3 MG MT LOZG: 1.0000 | LOZENGE | OROMUCOSAL | Status: DC | PRN

## 2015-07-12 MED ORDER — LACTATED RINGERS IV SOLN
INTRAVENOUS | Status: DC | PRN
  Administered 2015-07-12 (×3): via INTRAVENOUS

## 2015-07-12 MED ORDER — HYDROCORTISONE 1 % EX CREA
TOPICAL_CREAM | Freq: Two times a day (BID) | CUTANEOUS | Status: DC | PRN
  Administered 2015-07-12: 18:00:00 via TOPICAL
  Filled 2015-07-12: qty 28

## 2015-07-12 MED ORDER — LIDOCAINE HCL (CARDIAC) 20 MG/ML IV SOLN
INTRAVENOUS | Status: AC
  Filled 2015-07-12: qty 5

## 2015-07-12 MED ORDER — ALLOPURINOL 300 MG PO TABS
300.0000 mg | ORAL_TABLET | Freq: Every day | ORAL | Status: DC
  Administered 2015-07-13: 300 mg via ORAL
  Filled 2015-07-12 (×2): qty 1

## 2015-07-12 MED ORDER — 0.9 % SODIUM CHLORIDE (POUR BTL) OPTIME
TOPICAL | Status: DC | PRN
  Administered 2015-07-12: 1000 mL

## 2015-07-12 MED ORDER — DEXTROSE 5 % IV SOLN
10.0000 mg | INTRAVENOUS | Status: DC | PRN
  Administered 2015-07-12: 40 ug/min via INTRAVENOUS

## 2015-07-12 MED ORDER — SODIUM CHLORIDE 0.9 % IV SOLN
INTRAVENOUS | Status: DC | PRN
  Administered 2015-07-12: 12:00:00 via INTRAVENOUS

## 2015-07-12 MED ORDER — THROMBIN 20000 UNITS EX SOLR
CUTANEOUS | Status: DC | PRN
  Administered 2015-07-12: 20 mL via TOPICAL

## 2015-07-12 MED ORDER — ROCURONIUM BROMIDE 50 MG/5ML IV SOLN
INTRAVENOUS | Status: AC
  Filled 2015-07-12: qty 1

## 2015-07-12 MED ORDER — ROCURONIUM BROMIDE 100 MG/10ML IV SOLN
INTRAVENOUS | Status: DC | PRN
  Administered 2015-07-12 (×2): 10 mg via INTRAVENOUS
  Administered 2015-07-12: 5 mg via INTRAVENOUS
  Administered 2015-07-12: 10 mg via INTRAVENOUS
  Administered 2015-07-12: 45 mg via INTRAVENOUS
  Administered 2015-07-12: 10 mg via INTRAVENOUS
  Administered 2015-07-12: 20 mg via INTRAVENOUS

## 2015-07-12 MED ORDER — PROMETHAZINE HCL 25 MG/ML IJ SOLN: 6.2500 mg | INTRAMUSCULAR | Status: DC | PRN

## 2015-07-12 MED ORDER — ACETAMINOPHEN 325 MG PO TABS: 650.0000 mg | ORAL_TABLET | ORAL | Status: DC | PRN

## 2015-07-12 MED ORDER — CYCLOBENZAPRINE HCL 10 MG PO TABS
10.0000 mg | ORAL_TABLET | Freq: Three times a day (TID) | ORAL | Status: DC | PRN
  Administered 2015-07-13: 10 mg via ORAL
  Filled 2015-07-12 (×2): qty 1

## 2015-07-12 MED ORDER — CHLORHEXIDINE GLUCONATE CLOTH 2 % EX PADS
6.0000 | MEDICATED_PAD | Freq: Every day | CUTANEOUS | Status: DC
  Administered 2015-07-13 – 2015-07-14 (×2): 6 via TOPICAL

## 2015-07-12 MED ORDER — OXYCODONE HCL 5 MG/5ML PO SOLN: 5.0000 mg | Freq: Once | ORAL | Status: DC | PRN

## 2015-07-12 MED ORDER — FENTANYL CITRATE (PF) 250 MCG/5ML IJ SOLN
INTRAMUSCULAR | Status: AC
  Filled 2015-07-12: qty 5

## 2015-07-12 MED ORDER — EPHEDRINE SULFATE 50 MG/ML IJ SOLN
INTRAMUSCULAR | Status: AC
  Filled 2015-07-12: qty 1

## 2015-07-12 MED ORDER — SODIUM CHLORIDE 0.9 % IJ SOLN
INTRAMUSCULAR | Status: AC
Start: 2015-07-12 — End: 2015-07-12
  Filled 2015-07-12: qty 10

## 2015-07-12 MED ORDER — MELATONIN 5 MG PO TABS: 1.0000 | ORAL_TABLET | Freq: Every evening | ORAL | Status: DC | PRN

## 2015-07-12 MED ORDER — ACETAMINOPHEN 650 MG RE SUPP: 650.0000 mg | RECTAL | Status: DC | PRN

## 2015-07-12 MED ORDER — PHENYLEPHRINE HCL 10 MG/ML IJ SOLN
INTRAMUSCULAR | Status: DC | PRN
  Administered 2015-07-12 (×4): 120 ug via INTRAVENOUS

## 2015-07-12 MED ORDER — PROPOFOL 10 MG/ML IV BOLUS
INTRAVENOUS | Status: AC
  Filled 2015-07-12: qty 20

## 2015-07-12 MED ORDER — TRAMADOL HCL 50 MG PO TABS: 50.0000 mg | ORAL_TABLET | Freq: Two times a day (BID) | ORAL | Status: DC | PRN

## 2015-07-12 MED ORDER — SODIUM CHLORIDE 0.9 % IJ SOLN
3.0000 mL | Freq: Two times a day (BID) | INTRAMUSCULAR | Status: DC
  Administered 2015-07-12 – 2015-07-13 (×4): 3 mL via INTRAVENOUS

## 2015-07-12 MED ORDER — SUCCINYLCHOLINE CHLORIDE 20 MG/ML IJ SOLN
INTRAMUSCULAR | Status: DC | PRN
  Administered 2015-07-12: 120 mg via INTRAVENOUS

## 2015-07-12 MED ORDER — HYDROMORPHONE HCL 1 MG/ML IJ SOLN
0.2500 mg | INTRAMUSCULAR | Status: DC | PRN
  Administered 2015-07-12 (×4): 0.5 mg via INTRAVENOUS

## 2015-07-12 MED ORDER — POTASSIUM CITRATE ER 10 MEQ (1080 MG) PO TBCR
10.0000 meq | EXTENDED_RELEASE_TABLET | Freq: Three times a day (TID) | ORAL | Status: DC
  Administered 2015-07-13 – 2015-07-14 (×4): 10 meq via ORAL
  Filled 2015-07-12 (×8): qty 1

## 2015-07-12 MED ORDER — MIDAZOLAM HCL 2 MG/2ML IJ SOLN
INTRAMUSCULAR | Status: AC
  Filled 2015-07-12: qty 2

## 2015-07-12 MED ORDER — HYDROMORPHONE HCL 1 MG/ML IJ SOLN: 0.5000 mg | INTRAMUSCULAR | Status: DC | PRN

## 2015-07-12 MED ORDER — VANCOMYCIN HCL 1000 MG IV SOLR
INTRAVENOUS | Status: AC
Start: 2015-07-12 — End: 2015-07-12
  Filled 2015-07-12: qty 1000

## 2015-07-12 MED ORDER — METOPROLOL SUCCINATE ER 25 MG PO TB24
25.0000 mg | ORAL_TABLET | Freq: Every day | ORAL | Status: DC
  Administered 2015-07-12 – 2015-07-13 (×2): 25 mg via ORAL
  Filled 2015-07-12 (×2): qty 1

## 2015-07-12 MED ORDER — DOCUSATE SODIUM 100 MG PO CAPS
100.0000 mg | ORAL_CAPSULE | Freq: Two times a day (BID) | ORAL | Status: DC
  Administered 2015-07-12 – 2015-07-13 (×4): 100 mg via ORAL
  Filled 2015-07-12 (×4): qty 1

## 2015-07-12 MED FILL — Sodium Chloride IV Soln 0.9%: INTRAVENOUS | Qty: 1000 | Status: AC

## 2015-07-12 MED FILL — Sodium Chloride Irrigation Soln 0.9%: Qty: 3000 | Status: AC

## 2015-07-12 MED FILL — Heparin Sodium (Porcine) Inj 1000 Unit/ML: INTRAMUSCULAR | Qty: 30 | Status: AC

## 2015-07-12 SURGICAL SUPPLY — 69 items
APL SKNCLS STERI-STRIP NONHPOA (GAUZE/BANDAGES/DRESSINGS) ×1
BAG DECANTER FOR FLEXI CONT (MISCELLANEOUS) ×3 IMPLANT
BENZOIN TINCTURE PRP APPL 2/3 (GAUZE/BANDAGES/DRESSINGS) ×3 IMPLANT
BLADE CLIPPER SURG (BLADE) IMPLANT
BLADE SURG 11 STRL SS (BLADE) ×3 IMPLANT
BONE ALLOSTEM MORSELIZED 5CC (Bone Implant) ×2 IMPLANT
BRUSH SCRUB EZ PLAIN DRY (MISCELLANEOUS) ×3 IMPLANT
BUR MATCHSTICK NEURO 3.0 LAGG (BURR) ×3 IMPLANT
BUR PRECISION FLUTE 6.0 (BURR) ×3 IMPLANT
CANISTER SUCT 3000ML PPV (MISCELLANEOUS) ×3 IMPLANT
CAP REVERE LOCKING (Cap) ×4 IMPLANT
CLOSURE WOUND 1/2 X4 (GAUZE/BANDAGES/DRESSINGS) ×2
CONT SPEC 4OZ CLIKSEAL STRL BL (MISCELLANEOUS) ×3 IMPLANT
COVER BACK TABLE 60X90IN (DRAPES) ×3 IMPLANT
DECANTER SPIKE VIAL GLASS SM (MISCELLANEOUS) ×3 IMPLANT
DRAPE C-ARM 42X72 X-RAY (DRAPES) ×3 IMPLANT
DRAPE C-ARMOR (DRAPES) ×3 IMPLANT
DRAPE LAPAROTOMY 100X72X124 (DRAPES) ×3 IMPLANT
DRAPE POUCH INSTRU U-SHP 10X18 (DRAPES) ×3 IMPLANT
DRAPE PROXIMA HALF (DRAPES) IMPLANT
DRAPE SURG 17X23 STRL (DRAPES) ×3 IMPLANT
DRSG OPSITE 4X5.5 SM (GAUZE/BANDAGES/DRESSINGS) ×2 IMPLANT
DRSG OPSITE POSTOP 4X8 (GAUZE/BANDAGES/DRESSINGS) ×2 IMPLANT
DURAPREP 26ML APPLICATOR (WOUND CARE) ×3 IMPLANT
ELECT BLADE 4.0 EZ CLEAN MEGAD (MISCELLANEOUS) ×3
ELECT REM PT RETURN 9FT ADLT (ELECTROSURGICAL) ×3
ELECTRODE BLDE 4.0 EZ CLN MEGD (MISCELLANEOUS) IMPLANT
ELECTRODE REM PT RTRN 9FT ADLT (ELECTROSURGICAL) ×1 IMPLANT
EVACUATOR 3/16  PVC DRAIN (DRAIN) ×2
EVACUATOR 3/16 PVC DRAIN (DRAIN) IMPLANT
GAUZE SPONGE 4X4 12PLY STRL (GAUZE/BANDAGES/DRESSINGS) ×3 IMPLANT
GAUZE SPONGE 4X4 16PLY XRAY LF (GAUZE/BANDAGES/DRESSINGS) IMPLANT
GLOVE BIO SURGEON STRL SZ8 (GLOVE) ×6 IMPLANT
GLOVE ECLIPSE 7.5 STRL STRAW (GLOVE) IMPLANT
GLOVE EXAM NITRILE LRG STRL (GLOVE) IMPLANT
GLOVE EXAM NITRILE MD LF STRL (GLOVE) IMPLANT
GLOVE EXAM NITRILE XL STR (GLOVE) IMPLANT
GLOVE EXAM NITRILE XS STR PU (GLOVE) IMPLANT
GLOVE INDICATOR 8.5 STRL (GLOVE) ×6 IMPLANT
GOWN STRL REUS W/ TWL LRG LVL3 (GOWN DISPOSABLE) IMPLANT
GOWN STRL REUS W/ TWL XL LVL3 (GOWN DISPOSABLE) ×2 IMPLANT
GOWN STRL REUS W/TWL 2XL LVL3 (GOWN DISPOSABLE) ×2 IMPLANT
GOWN STRL REUS W/TWL LRG LVL3 (GOWN DISPOSABLE)
GOWN STRL REUS W/TWL XL LVL3 (GOWN DISPOSABLE) ×9
KIT BASIN OR (CUSTOM PROCEDURE TRAY) ×3 IMPLANT
KIT INFUSE X SMALL 1.4CC (Orthopedic Implant) ×2 IMPLANT
KIT ROOM TURNOVER OR (KITS) ×3 IMPLANT
LIQUID BAND (GAUZE/BANDAGES/DRESSINGS) ×3 IMPLANT
MILL MEDIUM DISP (BLADE) ×2 IMPLANT
NDL HYPO 25X1 1.5 SAFETY (NEEDLE) ×1 IMPLANT
NEEDLE HYPO 25X1 1.5 SAFETY (NEEDLE) ×3 IMPLANT
NS IRRIG 1000ML POUR BTL (IV SOLUTION) ×3 IMPLANT
PACK LAMINECTOMY NEURO (CUSTOM PROCEDURE TRAY) ×3 IMPLANT
PAD ARMBOARD 7.5X6 YLW CONV (MISCELLANEOUS) ×9 IMPLANT
RASP 3.0MM (RASP) ×2 IMPLANT
ROD REVERE 6.35 CURVED 55MM (Rod) ×4 IMPLANT
SCREW REVERE 6.35 6.5MMX45 (Screw) ×4 IMPLANT
SPACER RISE 10X22 8-14MM-10 (Spacer) ×4 IMPLANT
SPONGE LAP 4X18 X RAY DECT (DISPOSABLE) IMPLANT
SPONGE SURGIFOAM ABS GEL 100 (HEMOSTASIS) ×3 IMPLANT
STRIP CLOSURE SKIN 1/2X4 (GAUZE/BANDAGES/DRESSINGS) ×4 IMPLANT
SUT VIC AB 0 CT1 18XCR BRD8 (SUTURE) ×2 IMPLANT
SUT VIC AB 0 CT1 8-18 (SUTURE) ×6
SUT VIC AB 2-0 CT1 18 (SUTURE) ×3 IMPLANT
SUT VIC AB 4-0 PS2 27 (SUTURE) ×3 IMPLANT
TAPE STRIPS DRAPE STRL (GAUZE/BANDAGES/DRESSINGS) ×2 IMPLANT
TOWEL OR 17X24 6PK STRL BLUE (TOWEL DISPOSABLE) ×3 IMPLANT
TOWEL OR 17X26 10 PK STRL BLUE (TOWEL DISPOSABLE) ×3 IMPLANT
WATER STERILE IRR 1000ML POUR (IV SOLUTION) ×3 IMPLANT

## 2015-07-12 NOTE — H&P (Signed)
RAYJON WERY is an 64 y.o. male.   Chief Complaint: Back pain HPI: Patient is a very pleasant 64 year old gentleman undergone previous L2-L5 posterior lumbar interbody fusion and did very well. On all throughout the last 2 years he said progressive worsening back pain with some radiation down to his posterior buttock he's been to trials of facet blocks and epidural steroid injections targeting L1 to the disc space above his fusion with significant temporary relief. Imaging has shown progressive deterioration degeneration and breakdown at that this pain is. And due to patient's failure conservative treatment imaging findings and progression of clinical syndrome I recommended extension of his fusion up to include L1-L2. I've extensively gone over the risks and benefits of the operation with the patient as well as perioperative course expectations of outcome and alternatives of surgery and he understands and agrees to proceed forward.  Past Medical History  Diagnosis Date  . Hypertension   . History of stress test 2006    told that it was normal- done in [facility], not sure where  . Anxiety     clonazepam for sleep   . H/O renal calculi     lithotripsy- , several times, done in [facility]- last one Aug. 2016  . Arthritis     spondylolisthesis- lumbar, L shoulder, knee- R  . Gout     Past Surgical History  Procedure Laterality Date  . Back surgery    . Tonsillectomy      History reviewed. No pertinent family history. Social History:  reports that he quit smoking about 11 years ago. He does not have any smokeless tobacco history on file. He reports that he drinks alcohol. He reports that he does not use illicit drugs.  Allergies: No Known Allergies  Medications Prior to Admission  Medication Sig Dispense Refill  . allopurinol (ZYLOPRIM) 300 MG tablet Take 300 mg by mouth daily.    Marland Kitchen atorvastatin (LIPITOR) 40 MG tablet Take 1 tablet by mouth at bedtime.   0  . clonazePAM (KLONOPIN) 1  MG tablet Take 1 mg by mouth at bedtime. Only at bedtime, PRN    . Glucos-Chondroit-Hyaluron-MSM (GLUCOSAMINE CHONDROITIN JOINT PO) Take 1 tablet by mouth 2 (two) times daily.    . hydrochlorothiazide (HYDRODIURIL) 25 MG tablet Take 1 tablet by mouth daily.  0  . ibuprofen (ADVIL,MOTRIN) 200 MG tablet Take 400 mg by mouth every 6 (six) hours as needed.    . Melatonin 5 MG TABS Take 1 tablet by mouth at bedtime as needed.    . metoprolol succinate (TOPROL-XL) 25 MG 24 hr tablet Take 1 tablet by mouth at bedtime.   0  . Potassium Citrate 15 MEQ (1620 MG) TBCR Take 1 tablet by mouth 3 (three) times daily.   10  . traMADol (ULTRAM) 50 MG tablet Take 50 mg by mouth every 12 (twelve) hours as needed for moderate pain or severe pain.      No results found for this or any previous visit (from the past 48 hour(s)). No results found.  Review of Systems  Constitutional: Negative.   HENT: Negative.   Eyes: Negative.   Respiratory: Negative.   Cardiovascular: Negative.   Gastrointestinal: Negative.   Genitourinary: Negative.   Musculoskeletal: Positive for myalgias, back pain and joint pain.  Skin: Negative.   Neurological: Negative.   Endo/Heme/Allergies: Negative.   Psychiatric/Behavioral: Negative.     Blood pressure 144/80, pulse 78, temperature 98.5 F (36.9 C), temperature source Oral, resp. rate 20,  height 6\' 1"  (1.854 m), weight 128.595 kg (283 lb 8 oz), SpO2 95 %. Physical Exam  Constitutional: He is oriented to person, place, and time. He appears well-developed and well-nourished.  HENT:  Head: Normocephalic.  Eyes: Pupils are equal, round, and reactive to light.  Neck: Normal range of motion.  Cardiovascular: Normal rate.   Respiratory: Effort normal and breath sounds normal.  GI: Soft. Bowel sounds are normal.  Neurological: He is alert and oriented to person, place, and time. He has normal strength. GCS eye subscore is 4. GCS verbal subscore is 5. GCS motor subscore is 6.   Strength is 5 out of 5 in his iliopsoas, quads, hamstrings, gastrocs, and tibialis, EHL.  Skin: Skin is warm and dry.     Assessment/Plan 89 year gentleman presents for L1-L2 extension of fusion.  Nadalee Neiswender P 07/12/2015, 7:47 AM

## 2015-07-12 NOTE — Anesthesia Postprocedure Evaluation (Signed)
Anesthesia Post Note  Patient: Jason Kidd  Procedure(s) Performed: Procedure(s) (LRB): L1-2 POSTERIOR LUMBAR FUSION  (N/A)  Patient location during evaluation: PACU Anesthesia Type: General Level of consciousness: awake and alert Pain management: pain level controlled Vital Signs Assessment: post-procedure vital signs reviewed and stable Respiratory status: spontaneous breathing, nonlabored ventilation, respiratory function stable and patient connected to nasal cannula oxygen Cardiovascular status: blood pressure returned to baseline and stable Postop Assessment: no signs of nausea or vomiting Anesthetic complications: no    Last Vitals:  Filed Vitals:   07/12/15 1215 07/12/15 1221  BP:  122/89  Pulse: 76 77  Temp:    Resp: 12 10    Last Pain:  Filed Vitals:   07/12/15 1225  PainSc: 5                  Reino KentJudd, Schae Cando J

## 2015-07-12 NOTE — Transfer of Care (Signed)
Immediate Anesthesia Transfer of Care Note  Patient: Jason Kidd  Procedure(s) Performed: Procedure(s) with comments: L1-2 POSTERIOR LUMBAR FUSION  (N/A) - L1-2 POSTERIOR LUMBAR FUSION   Patient Location: PACU  Anesthesia Type:General  Level of Consciousness: awake, alert  and oriented  Airway & Oxygen Therapy: Patient Spontanous Breathing and Patient connected to nasal cannula oxygen  Post-op Assessment: Report given to RN, Post -op Vital signs reviewed and stable and Patient moving all extremities  Post vital signs: Reviewed and stable  Last Vitals:  Filed Vitals:   07/12/15 0725  BP: 144/80  Pulse: 78  Temp: 36.9 C  Resp: 20    Complications: No apparent anesthesia complications

## 2015-07-12 NOTE — Anesthesia Procedure Notes (Addendum)
Procedure Name: Intubation Date/Time: 07/12/2015 8:36 AM Performed by: Orvilla FusATO, Cortnee Steinmiller A Pre-anesthesia Checklist: Patient identified, Timeout performed, Emergency Drugs available, Suction available and Patient being monitored Patient Re-evaluated:Patient Re-evaluated prior to inductionOxygen Delivery Method: Circle system utilized Preoxygenation: Pre-oxygenation with 100% oxygen Intubation Type: IV induction Ventilation: Mask ventilation without difficulty and Oral airway inserted - appropriate to patient size Laryngoscope Size: Mac and 4 Grade View: Grade III Tube type: Oral Tube size: 7.5 mm Number of attempts: 2 (DLx1  CRNA, DLx1 MDA) Airway Equipment and Method: Bougie stylet Placement Confirmation: breath sounds checked- equal and bilateral and positive ETCO2 (ETT inserted over bougie stylet) Secured at: 23 cm Tube secured with: Tape Dental Injury: Teeth and Oropharynx as per pre-operative assessment  Difficulty Due To: Difficulty was anticipated and Difficult Airway- due to anterior larynx Comments: Bougie passed by MDA without issue

## 2015-07-12 NOTE — Progress Notes (Signed)
PHARMACIST - PHYSICIAN ORDER COMMUNICATION  CONCERNING: P&T Medication Policy on Herbal Medications  DESCRIPTION:  This patient's order for:  Melatonin, glucosamine  has been noted.  This product(s) is classified as an "herbal" or natural product. Due to a lack of definitive safety studies or FDA approval, nonstandard manufacturing practices, plus the potential risk of unknown drug-drug interactions while on inpatient medications, the Pharmacy and Therapeutics Committee does not permit the use of "herbal" or natural products of this type within [facility].   ACTION TAKEN: The pharmacy department is unable to verify this order at this time and your patient has been informed of this safety policy. Please reevaluate patient's clinical condition at discharge and address if the herbal or natural product(s) should be resumed at that time.   Tad MooreJessica Carney, Pharm D, BCPS  Clinical Pharmacist Pager [phone]  07/12/2015 2:40 PM

## 2015-07-12 NOTE — Evaluation (Signed)
Physical Therapy Evaluation Patient Details Name: Jason CraneWilliam F Kidd MRN: [id number] DOB: 03/01/1951 Today's Date: 07/12/2015   History of Present Illness  pt is a 64 y/o male with h/o multiple back sx/fusions admitted for lumbar surgery due to recurrent back pain, s/p L12 decompression and PLIF with removal of hardware at L23.  Clinical Impression  Pt admitted with/for lumbar fusion surgery.  Pt currently limited functionally due to the problems listed below.  (see problems list.)  Pt will benefit from PT to maximize function and safety to be able to get home safely with available assist of family.     Follow Up Recommendations No PT follow up    Equipment Recommendations  None recommended by PT    Recommendations for Other Services       Precautions / Restrictions Precautions Precautions: Back Required Braces or Orthoses: Spinal Brace Spinal Brace: Lumbar corset Restrictions Weight Bearing Restrictions: No      Mobility  Bed Mobility Overal bed mobility: Needs Assistance Bed Mobility: Rolling;Sidelying to Sit Rolling: Supervision Sidelying to sit: Supervision       General bed mobility comments: reinforced safe technique  Transfers Overall transfer level: Needs assistance   Transfers: Sit to/from Stand Sit to Stand: Min guard            Ambulation/Gait Ambulation/Gait assistance: Min guard Ambulation Distance (Feet): 200 Feet Assistive device: Rolling walker (2 wheeled) Gait Pattern/deviations: Step-through pattern     General Gait Details: steady, but guarded  Stairs            Wheelchair Mobility    Modified Rankin (Stroke Patients Only)       Balance Overall balance assessment: Needs assistance Sitting-balance support: No upper extremity supported Sitting balance-Leahy Scale: Good       Standing balance-Leahy Scale: Fair                               Pertinent Vitals/Pain Pain Assessment: 0-10 Pain Score: 5  Pain  Location: incision Pain Descriptors / Indicators: Burning Pain Intervention(s): Monitored during session    Home Living Family/patient expects to be discharged to:: Private residence Living Arrangements: Spouse/significant other Available Help at Discharge: Family;Available 24 hours/day Type of Home: House Home Access: Stairs to enter Entrance Stairs-Rails: Right;Left Entrance Stairs-Number of Steps: several Home Layout: Two level;Able to live on main level with bedroom/bathroom Home Equipment: Walker - 2 wheels      Prior Function Level of Independence: Independent               Hand Dominance        Extremity/Trunk Assessment   Upper Extremity Assessment: Overall WFL for tasks assessed           Lower Extremity Assessment: Overall WFL for tasks assessed         Communication   Communication: No difficulties  Cognition Arousal/Alertness: Awake/alert Behavior During Therapy: WFL for tasks assessed/performed Overall Cognitive Status: Within Functional Limits for tasks assessed                      General Comments General comments (skin integrity, edema, etc.): pt/wife instructed in back care/precautions, log roll, bracing issues, lifting restrictions and progression of activity.    Exercises        Assessment/Plan    PT Assessment Patient needs continued PT services  PT Diagnosis Acute pain   PT Problem List Decreased activity tolerance;Decreased mobility;Decreased knowledge of  use of DME;Decreased knowledge of precautions;Pain  PT Treatment Interventions DME instruction;Gait training;Stair training;Functional mobility training;Therapeutic activities;Patient/family education   PT Goals (Current goals can be found in the Care Plan section) Acute Rehab PT Goals Patient Stated Goal: home, independent PT Goal Formulation: With patient Time For Goal Achievement: 07/19/15 Potential to Achieve Goals: Good    Frequency Min 5X/week   Barriers  to discharge        Co-evaluation               End of Session   Activity Tolerance: Patient tolerated treatment well Patient left: Other (comment);with call bell/phone within reach (on toilet for nursing to get to bed) Nurse Communication: Mobility status         Time: 1705-1730 PT Time Calculation (min) (ACUTE ONLY): 25 min   Charges:   PT Evaluation $Initial PT Evaluation Tier I: 1 Procedure PT Treatments $Gait Training: 8-22 mins   PT G Codes:        Jason Kidd, Jason Kidd 07/12/2015, 5:49 PM 07/12/2015  Jason Kidd, PT [phone]  (pager)

## 2015-07-12 NOTE — Anesthesia Preprocedure Evaluation (Addendum)
Anesthesia Evaluation  Patient identified by MRN, date of birth, ID band Patient awake    Reviewed: Allergy & Precautions, H&P , NPO status , Patient's Chart, lab work & pertinent test results  History of Anesthesia Complications Negative for: history of anesthetic complications  Airway Mallampati: IV  TM Distance: >3 FB Neck ROM: full   Comment: Large neck circumference Dental no notable dental hx.    Pulmonary neg pulmonary ROS, former smoker,    Pulmonary exam normal breath sounds clear to auscultation       Cardiovascular hypertension, Pt. on home beta blockers Normal cardiovascular exam Rhythm:regular Rate:Normal  2006 stress test that was normal   Neuro/Psych Anxiety negative neurological ROS     GI/Hepatic negative GI ROS, Neg liver ROS,   Endo/Other  obesity  Renal/GU negative Renal ROS     Musculoskeletal  (+) Arthritis ,   Abdominal   Peds  Hematology negative hematology ROS (+)   Anesthesia Other Findings gout  Reproductive/Obstetrics negative OB ROS                            Anesthesia Physical Anesthesia Plan  ASA: II  Anesthesia Plan: General   Post-op Pain Management:    Induction: Intravenous  Airway Management Planned: Oral ETT  Additional Equipment:   Intra-op Plan:   Post-operative Plan: Extubation in OR  Informed Consent: I have reviewed the patients History and Physical, chart, labs and discussed the procedure including the risks, benefits and alternatives for the proposed anesthesia with the patient or authorized representative who has indicated his/her understanding and acceptance.   Dental Advisory Given  Plan Discussed with: Anesthesiologist and CRNA  Anesthesia Plan Comments:        Anesthesia Quick Evaluation

## 2015-07-12 NOTE — Op Note (Signed)
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  #2 decompressive lumbar laminectomy L1-L2 in excess and requiring more work than would be needed with a standard interbody fusion with complete medial facetectomies and radical foraminotomies of the L1 and L2 nerve roots  #3 posterior lumbar interbody fusion L1-L2 using the globus rise expandable cage system packed with locally harvested autograft mixed with allostem morsels and BMP  #4 pedicle screw fixation L1 and L2 using 6 5 x 4 45 mm pedicle screws from globus Revere 6.35 at L1 and using the Legacy screws at L2  #5 posterior lateral arthrodesis L1-L2 utilizing the locally harvested autograft mix and BMP.  Surgeon: Jillyn Hidden Deshae Dickison  Asst.: Marikay Alar  Anesthesia: Gen.  EBL: 500 was 250 of Cell Saver given back  History of present illness: 64 year old gentleman who previously undergone L2-L5 fusion did very well however last few years is a progress worsening back pain bilateral hip pain and workup has revealed progressive deterioration degeneration and instability L1-L2. Patient is undergone facet blocks and epidural injections with limited relief and due to his failure conservative treatment imaging findings and progression of clinical syndrome I recommended a fusion extension at L1-L2 with either addition or removal of hardware L2-3. I've extensively reviewed the risks and benefits of the operation with the patient as well as perioperative course expectations of outcome and alternatives of surgery and he understands and agrees to proceed forward.  Operative procedure: Patient brought into the or was induced under general anesthesia positioned prone on the [facility] table his back was prepped and draped in routine sterile  fashion is old incision was opened up and extended cephalad subperiosteal dissections care lamina of L1 and T12 exposing the TPs at L1 and exposed the hardware from L2-L3. After adequate exposure and remove the spinous process at L1 and began the central decompression. There was marked hourglass compression of thecal sac and severe stenosis this is all teased away by dissecting the scar off the dura off the undersurface of the facet joint and performing complete medial facetectomies. Radical foraminal images were performed of the L1-L2 nerve root decompressing both nerve roots significantly out the foramen. After adequate decompression been achieved attention taken to discectomy bilaterally disc space was incised and using sequential distraction I opened up the disc space and this further reduced the deformity and opened up the L1 and L2 foramen. Then with a 9 distractor in place using a paddle shavers Coble Epstein curettes I cleaned out the interspace and inserted an 8 expandable cage and then packed with local autograft mix. Removed the distractor from the contralateral side cleaned out the disc space and packed local autograft BMP and the mix centrally and laterally. Then inserted contralateral cage and expanded both cages approximate 3 turns. Then the placed both pedicle screws at L1 all screws excellent purchase. Fluoroscopy confirmed good position of the implants and then I cut the rod between L2 and L3 removed and not remove the rod remnant then was copious irrigated to see Mrs. was maintained dissecting out the TPS L1 and the lateral usually mass I aggressively decorticated this area packed local autograft mixed along with BMP posterior laterally from L1 and L2. Then I took 55 mm rods placed them and compressed L1-L2 to further reduce the kyphotic deformity. Then anchored the nuts  in place recheck the foramina to confirm patency no migration of graft material laid Gelfoam over the dura place a drain  speckled some vancomycin powder and closed the wound layers with interrupted Vicryl's and running 4 subcuticular and skin Dermabond benzo and Steri-Strips and sterile dressing was applied. Postop fluoroscopy confirmed good position of all the implants. At the end of case all needle counts sponge counts were correct.

## 2015-07-13 MED ORDER — DEXAMETHASONE SODIUM PHOSPHATE 10 MG/ML IJ SOLN
10.0000 mg | Freq: Four times a day (QID) | INTRAMUSCULAR | Status: AC
  Administered 2015-07-13 (×2): 10 mg via INTRAVENOUS
  Filled 2015-07-13 (×2): qty 1

## 2015-07-13 NOTE — Progress Notes (Signed)
Utilization review completed.  

## 2015-07-13 NOTE — Progress Notes (Signed)
Occupational Therapy Evaluation Patient Details Name: Jason Kidd MRN: [id number] DOB: 01/20/1951 Today's Date: 07/13/2015    History of Present Illness pt is a 64 y/o male with h/o multiple back sx/fusions admitted for lumbar surgery due to recurrent back pain, s/p L12 decompression and PLIF with removal of hardware at L23.   Clinical Impression   Patient presents to OT with decreased ADL independence and safety due to the deficits listed below. He will benefit from skilled OT to maximize independence to allow him to d/c home with his wife.    Follow Up Recommendations  No OT follow up;Supervision - Intermittent    Equipment Recommendations  3 in 1 bedside comode;Tub/shower seat (pt unsure about 3 in 1; will let nurse know)    Recommendations for Other Services       Precautions / Restrictions Precautions Precautions: Back Precaution Comments: reviewed all back precautions with patient Required Braces or Orthoses: Spinal Brace Spinal Brace: Lumbar corset;Applied in sitting position Restrictions Weight Bearing Restrictions: No      Mobility Bed Mobility                  Transfers                      Balance                                            ADL Overall ADL's : Needs assistance/impaired Eating/Feeding: Independent;Sitting   Grooming: Set up;Sitting   Upper Body Bathing: Set up;Sitting   Lower Body Bathing: Minimal assistance;Sit to/from stand   Upper Body Dressing : Minimal assistance;Sitting   Lower Body Dressing: Moderate assistance;Sit to/from stand                 General ADL Comments: Patient received in bed; reports he had just gotten back to bed after being up with PT and then in the chair. Did not want to get OOB during this session. Educated pt on 3 in 1 and use over toilet. He was unsure if he needed it and will think about it. Educated about shower transfer; patient states he does need a shower  chair for home. Informed nurse. Discussion of LB bathing/dressing techniques. Patient able to cross leg over the other to access feet for socks/shoes. He will don pants in bed then stand to pull them up over his hips. He has a long sponge at home. He also reported his wife could assist as needed. Will follow up with patient tomorrow to review all and practice bathroom transfers.      Vision     Perception     Praxis      Pertinent Vitals/Pain Pain Assessment: 0-10 Pain Score: 5  Pain Location: back Pain Descriptors / Indicators: Discomfort;Operative site guarding Pain Intervention(s): Limited activity within patient's tolerance;Monitored during session     Hand Dominance Right   Extremity/Trunk Assessment Upper Extremity Assessment Upper Extremity Assessment: Overall WFL for tasks assessed   Lower Extremity Assessment Lower Extremity Assessment: Defer to PT evaluation       Communication Communication Communication: No difficulties   Cognition Arousal/Alertness: Awake/alert Behavior During Therapy: WFL for tasks assessed/performed Overall Cognitive Status: Within Functional Limits for tasks assessed                     General Comments  Exercises       Shoulder Instructions      Home Living Family/patient expects to be discharged to:: Private residence Living Arrangements: Spouse/significant other Available Help at Discharge: Family;Available 24 hours/day Type of Home: House Home Access: Stairs to enter Entergy CorporationEntrance Stairs-Number of Steps: several Entrance Stairs-Rails: Right;Left Home Layout: Two level;Able to live on main level with bedroom/bathroom Alternate Level Stairs-Number of Steps: flight Alternate Level Stairs-Rails: Left;Right Bathroom Shower/Tub: Chief Strategy OfficerTub/shower unit   Bathroom Toilet: Standard     Home Equipment: Environmental consultantWalker - 2 wheels;Grab bars - tub/shower          Prior Functioning/Environment Level of Independence: Independent              OT Diagnosis: Acute pain   OT Problem List: Decreased activity tolerance;Decreased knowledge of use of DME or AE;Decreased knowledge of precautions;Pain   OT Treatment/Interventions: Self-care/ADL training;DME and/or AE instruction;Therapeutic activities;Patient/family education    OT Goals(Current goals can be found in the care plan section) Acute Rehab OT Goals Patient Stated Goal: home, independent OT Goal Formulation: With patient Time For Goal Achievement: 07/27/15 Potential to Achieve Goals: Good  OT Frequency: Min 2X/week   Barriers to D/C:            Co-evaluation              End of Session Nurse Communication: Other (comment) (patient wants shower chair for home but unsure about 3 in 1)  Activity Tolerance: Patient tolerated treatment well Patient left: in bed;with call bell/phone within reach   Time: [id number] OT Time Calculation (min): 20 min Charges:  OT General Charges $OT Visit: 1 Procedure OT Evaluation $Initial OT Evaluation Tier I: 1 Procedure G-Codes:    Blossie Raffel A 07/13/2015, 1:25 PM

## 2015-07-13 NOTE — Progress Notes (Signed)
Physical Therapy Treatment Patient Details Name: Jason Kidd MRN: [id number] DOB: 03/28/1951 Today's Date: 07/13/2015    History of Present Illness pt is a 64 y/o male with h/o multiple back sx/fusions admitted for lumbar surgery due to recurrent back pain, s/p L12 decompression and PLIF with removal of hardware at L23.    PT Comments    Pt progressing towards physical therapy goals. Pt requires continued cues to maintain back precautions. Will continue to follow and progress as able per POC.   Follow Up Recommendations  No PT follow up     Equipment Recommendations  3in1 (PT) (vs. shower chair - will defer to OT)    Recommendations for Other Services       Precautions / Restrictions Precautions Precautions: Back Required Braces or Orthoses: Spinal Brace Spinal Brace: Lumbar corset;Applied in sitting position Restrictions Weight Bearing Restrictions: No    Mobility  Bed Mobility Overal bed mobility: Needs Assistance Bed Mobility: Rolling;Sidelying to Sit Rolling: Supervision Sidelying to sit: Supervision       General bed mobility comments: reinforced safe technique  Transfers Overall transfer level: Needs assistance Equipment used: Rolling walker (2 wheeled);None Transfers: Sit to/from Stand Sit to Stand: Supervision         General transfer comment: No assist to power-up to full standing position.   Ambulation/Gait Ambulation/Gait assistance: Min guard Ambulation Distance (Feet): 400 Feet Assistive device: Rolling walker (2 wheeled) Gait Pattern/deviations: Step-through pattern;Decreased stride length;Trunk flexed Gait velocity: Decreased Gait velocity interpretation: Below normal speed for age/gender General Gait Details: Initially with RW, but progressed to no AD. Occasional unsteadiness noted but pt did not require assist.    Stairs Stairs: Yes Stairs assistance: Supervision Stair Management: One rail Right;Alternating  pattern;Forwards Number of Stairs: 10 General stair comments: No assist required.   Wheelchair Mobility    Modified Rankin (Stroke Patients Only)       Balance Overall balance assessment: Needs assistance Sitting-balance support: Feet supported;No upper extremity supported Sitting balance-Leahy Scale: Good     Standing balance support: No upper extremity supported;During functional activity Standing balance-Leahy Scale: Fair                      Cognition Arousal/Alertness: Awake/alert Behavior During Therapy: WFL for tasks assessed/performed Overall Cognitive Status: Within Functional Limits for tasks assessed                      Exercises      General Comments        Pertinent Vitals/Pain Pain Assessment: Faces Faces Pain Scale: Hurts little more Pain Location: incision site Pain Descriptors / Indicators: Operative site guarding;Discomfort    Home Living                      Prior Function            PT Goals (current goals can now be found in the care plan section) Acute Rehab PT Goals Patient Stated Goal: home, independent PT Goal Formulation: With patient Time For Goal Achievement: 07/19/15 Potential to Achieve Goals: Good Progress towards PT goals: Progressing toward goals    Frequency  Min 5X/week    PT Plan Current plan remains appropriate    Co-evaluation             End of Session Equipment Utilized During Treatment: Back brace Activity Tolerance: Patient tolerated treatment well Patient left: in chair;with call bell/phone within reach  Time: 9604-5409 PT Time Calculation (min) (ACUTE ONLY): 27 min  Charges:  $Gait Training: 23-37 mins                    G Codes:      Conni Slipper Jul 17, 2015, 9:21 AM   Conni Slipper, PT, DPT Acute Rehabilitation Services Pager: [phone]

## 2015-07-13 NOTE — Progress Notes (Signed)
Subjective: Patient reports No back pain no leg pain he is having some tenderness around his rectum consistent with what could be hemorrhoids although nothing visualized.  Objective: Vital signs in last 24 hours: Temp:  [97.6 F (36.4 C)-98.8 F (37.1 C)] 98.5 F (36.9 C) (12/08 0400) Pulse Rate:  [73-96] 88 (12/08 0400) Resp:  [8-20] 20 (12/08 0400) BP: (99-145)/(48-91) 108/64 mmHg (12/08 0400) SpO2:  [93 %-100 %] 94 % (12/08 0400)  Intake/Output from previous day: 12/07 0701 - 12/08 0700 In: 3430 [P.O.:1080; I.V.:2100; Blood:250] Out: 995 [Urine:315; Drains:280; Blood:400] Intake/Output this shift:    Strength 5 out of 5 wound clean dry and intact  Lab Results: No results for input(s): WBC, HGB, HCT, PLT in the last 72 hours. BMET No results for input(s): NA, K, CL, CO2, GLUCOSE, BUN, CREATININE, CALCIUM in the last 72 hours.  Studies/Results: Dg Lumbar Spine 2-3 Views  07/12/2015  CLINICAL DATA:  L1-2 fusion and previous hardware removal. EXAM: DG C-ARM 61-120 MIN; LUMBAR SPINE - 2-3 VIEW COMPARISON:  10/17/2011 FINDINGS: Remote posterior and interbody fusion hardware from L2-L5. Subsequent L1-2 posterior fusion and interbody fusion device. No complicating features. IMPRESSION: New L1-2 fusion hardware in good position without complicating features. Electronically Signed   By: Rudie MeyerP.  Gallerani M.D.   On: 07/12/2015 12:41   Dg C-arm 1-60 Min  07/12/2015  CLINICAL DATA:  L1-2 fusion and previous hardware removal. EXAM: DG C-ARM 61-120 MIN; LUMBAR SPINE - 2-3 VIEW COMPARISON:  10/17/2011 FINDINGS: Remote posterior and interbody fusion hardware from L2-L5. Subsequent L1-2 posterior fusion and interbody fusion device. No complicating features. IMPRESSION: New L1-2 fusion hardware in good position without complicating features. Electronically Signed   By: Rudie MeyerP.  Gallerani M.D.   On: 07/12/2015 12:41    Assessment/Plan: Progressive mobilization today we will try a couple doses of IV  Decadron.  LOS: 1 day     Eliese Kerwood P 07/13/2015, 8:13 AM

## 2015-07-14 MED ORDER — OXYCODONE-ACETAMINOPHEN 5-325 MG PO TABS: 1.0000 | ORAL_TABLET | ORAL | Status: AC | PRN

## 2015-07-14 NOTE — Discharge Summary (Signed)
  Physician Discharge Summary  Patient ID: Jason CraneWilliam F Kidd MRN: [id number] DOB/AGE: 64/04/1951 64 y.o.  Admit date: 07/12/2015 Discharge date: 07/14/2015  Admission Diagnoses: Lumbar spondylosis and stability herniated nuclear pulposus and lumbar spinal stenosis L1-L2  Discharge Diagnoses: Same Active Problems:   Spinal stenosis of lumbar region   Discharged Condition: good  Hospital Course: patient was admitted hospital underwent decompressive laminectomy and fusion L1-L2 with removal hardware L2-3.   postop patient did very well his only complaint was burning around his rectum no visible signs of hemorrhoids or trauma was displayed this was ultimately attributed to chlorhexidine preoperative prep possibly irritating a small anal fissure. Neurologically patient remained intact was mobilized and ambulated well voiding spontaneously and stable for discharge.  Consults: Significant Diagnostic Studies: Treatments: decompressive laminectomy and fusion L1-L2 Discharge Exam: Blood pressure 111/62, pulse 81, temperature 98.2 F (36.8 C), temperature source Oral, resp. rate 18, height 6\' 1"  (1.854 m), weight 128.595 kg (283 lb 8 oz), SpO2 95 %.  strength 5 out of 5 wound clean dry and intact Dispositionhome     Medication List    TAKE these medications        allopurinol 300 MG tablet  Commonly known as:  ZYLOPRIM  Take 300 mg by mouth daily.     atorvastatin 40 MG tablet  Commonly known as:  LIPITOR  Take 1 tablet by mouth at bedtime.     clonazePAM 1 MG tablet  Commonly known as:  KLONOPIN  Take 1 mg by mouth at bedtime. Only at bedtime, PRN     GLUCOSAMINE CHONDROITIN JOINT PO  Take 1 tablet by mouth 2 (two) times daily.     hydrochlorothiazide 25 MG tablet  Commonly known as:  HYDRODIURIL  Take 1 tablet by mouth daily.     ibuprofen 200 MG tablet  Commonly known as:  ADVIL,MOTRIN  Take 400 mg by mouth every 6 (six) hours as needed.     Melatonin 5 MG Tabs  Take 1  tablet by mouth at bedtime as needed.     metoprolol succinate 25 MG 24 hr tablet  Commonly known as:  TOPROL-XL  Take 1 tablet by mouth at bedtime.     oxyCODONE-acetaminophen 5-325 MG tablet  Commonly known as:  PERCOCET/ROXICET  Take 1-2 tablets by mouth every 4 (four) hours as needed for moderate pain.     Potassium Citrate 15 MEQ (1620 MG) Tbcr  Take 1 tablet by mouth 3 (three) times daily.     traMADol 50 MG tablet  Commonly known as:  ULTRAM  Take 50 mg by mouth every 12 (twelve) hours as needed for moderate pain or severe pain.           Follow-up Information    [facility]Dewitte Vannice P, MD.   Specialty:  Neurosurgery   Contact information:   [address] [place] [address] [phone]       Signed: Mariam DollarCRAM,Enedelia Martorelli P 07/14/2015, 7:20 AM

## 2015-07-14 NOTE — Progress Notes (Signed)
Physical Therapy Treatment/Discharge Patient Details Name: Jason Kidd MRN: [id number] DOB: [birth year] Today's Date: 07/14/2015    History of Present Illness pt is a 64 y/o male with h/o multiple back sx/fusions admitted for lumbar surgery due to recurrent back pain, s/p L12 decompression and PLIF with removal of hardware at L23.    PT Comments    Pt performed mobility very well today with no difficulties during ambulation or stair navigation. Pt is able to perform mobility safely and functionally so continued acute rehab is no longer indicated at this time. D/C to home is appropriate once medically cleared.    Follow Up Recommendations  No PT follow up     Equipment Recommendations  3in1 (PT)    Recommendations for Other Services       Precautions / Restrictions Precautions Precautions: Back Precaution Comments: Pt able to recall 2/3 back precautions. Reviewed all precautions with pt. Required Braces or Orthoses: Spinal Brace Spinal Brace: Lumbar corset;Applied in sitting position Restrictions Weight Bearing Restrictions: No    Mobility  Bed Mobility               General bed mobility comments: Pt OOB in chair  Transfers Overall transfer level: Needs assistance Equipment used: None Transfers: Sit to/from Stand Sit to Stand: Supervision         General transfer comment: Supervision for safety, no physical assist required. Demonstrated proper technique and hand placement. Sit to stand x 1  Ambulation/Gait Ambulation/Gait assistance: Supervision Ambulation Distance (Feet): 400 Feet Assistive device: None Gait Pattern/deviations: Step-through pattern;WFL(Within Functional Limits)   Gait velocity interpretation: at or above normal speed for age/gender General Gait Details: No unsteadiness during ambulation and was able to multitask without gait disruptions. Minor VC to keep head up when walking. Supervision for safety   Stairs Stairs: Yes Stairs  assistance: Supervision Stair Management: One rail Right;Alternating pattern;Forwards Number of Stairs: 20 General stair comments: Pt navigated stairs at a brisk pace without unsteadiness. No physical assist required. Supervision for safety  Wheelchair Mobility    Modified Rankin (Stroke Patients Only)       Balance Overall balance assessment: Needs assistance Sitting-balance support: Feet supported Sitting balance-Leahy Scale: Good     Standing balance support: No upper extremity supported Standing balance-Leahy Scale: Good                      Cognition Arousal/Alertness: Awake/alert Behavior During Therapy: WFL for tasks assessed/performed Overall Cognitive Status: Within Functional Limits for tasks assessed                      Exercises      General Comments        Pertinent Vitals/Pain Pain Assessment: No/denies pain Faces Pain Scale: Hurts a little bit Pain Location: back Pain Descriptors / Indicators: Discomfort;Sore Pain Intervention(s): Monitored during session    Home Living                      Prior Function            PT Goals (current goals can now be found in the care plan section) Acute Rehab PT Goals Patient Stated Goal: to go home today Progress towards PT goals: Progressing toward goals    Frequency  Min 5X/week    PT Plan Current plan remains appropriate    Co-evaluation             End of Session Equipment  Utilized During Treatment: Back brace Activity Tolerance: Patient tolerated treatment well Patient left: in chair;with call bell/phone within reach     Time: 0815-0828 PT Time Calculation (min) (ACUTE ONLY): 13 min  Charges:                       G CodesJimmy Picket:      Inas Avena 07/14/2015, 9:12 AM Jimmy PicketJustin Tomi Grandpre, SPT 07/14/2015 9:18 AM

## 2015-07-14 NOTE — Progress Notes (Signed)
Occupational Therapy Treatment Patient Details Name: Jason CraneWilliam F Kidd MRN: [id number] DOB: 06/14/1951 Today's Date: 07/14/2015    History of present illness pt is a 64 y/o male with h/o multiple back sx/fusions admitted for lumbar surgery due to recurrent back pain, s/p L12 decompression and PLIF with removal of hardware at L23.   OT comments  Pt making great progress toward OT goals this session. Pt is overall at a supervision level for UB/LB dressing, toilet transfers, and tub transfers. Pt able to recall 2/3 back precautions at beginning of session; educated on all precautions. ADL and safety education reviewed with pt; he verbalized understanding. Pt ready to d/c from an OT standpoint. Will continue to follow acutely.    Follow Up Recommendations  No OT follow up;Supervision - Intermittent    Equipment Recommendations  3 in 1 bedside comode (has already been delivered to pt)    Recommendations for Other Services      Precautions / Restrictions Precautions Precautions: Back Precaution Comments: Pt able to recall 2/3 back precautions. Reviewed all precautions with pt. Required Braces or Orthoses: Spinal Brace Spinal Brace: Lumbar corset;Applied in sitting position Restrictions Weight Bearing Restrictions: No       Mobility Bed Mobility               General bed mobility comments: Pt OOB in chair  Transfers Overall transfer level: Needs assistance   Transfers: Sit to/from Stand Sit to Stand: Supervision         General transfer comment: Supervision for safety, no physical assist required. Good technique and hand placement. Sit to stand from chair x 1, toilet x 1    Balance Overall balance assessment: Needs assistance         Standing balance support: No upper extremity supported Standing balance-Leahy Scale: Fair                     ADL Overall ADL's : Needs assistance/impaired     Grooming: Supervision/safety;Standing;Wash/dry hands            Upper Body Dressing : Supervision/safety;Standing Upper Body Dressing Details (indicate cue type and reason): Pt able to don shirt in standing while maintaining back precautions. Pt reports he is able to don/doff back brace independently.  Lower Body Dressing: Supervision/safety;Sit to/from stand Lower Body Dressing Details (indicate cue type and reason): Pt able to cross bilateral legs to don socks/shoes. Pt reports he stood to don pants this AM; recommended that pt sit while donning/doffing LB clothing and use technique of crossing legs to get pants started over feet; pt verbalized understanding. Toilet Transfer: Supervision/safety;Ambulation;Regular Toilet;Grab bars Toilet Transfer Details (indicate cue type and reason): Recommended pt use 3 in 1 over toilet to provide raised surface and handles to push off from; pt verbalized understanding.    Toileting - Clothing Manipulation Details (indicate cue type and reason): Educated on maintaining back precautions during toilet hygiene. Tub/ Shower Transfer: Supervision/safety;Ambulation;3 in 1 Tub/Shower Transfer Details (indicate cue type and reason): Educated on use of 3 in 1 in tub for shower seat; pt verbalized understanding. Functional mobility during ADLs: Supervision/safety General ADL Comments: No family present for OT session. Educated on compensatory strategies for ADLs, home safety, need for supervision for safety during ADLs and mobility; pt verbalized understanding. Pt able to maintain back precautions during functional activity and throughout session.       Vision  Perception     Praxis      Cognition   Behavior During Therapy: WFL for tasks assessed/performed Overall Cognitive Status: Within Functional Limits for tasks assessed                       Extremity/Trunk Assessment               Exercises     Shoulder Instructions       General Comments      Pertinent  Vitals/ Pain       Pain Assessment: Faces Faces Pain Scale: Hurts a little bit Pain Location: back Pain Descriptors / Indicators: Discomfort;Sore Pain Intervention(s): Limited activity within patient's tolerance;Monitored during session  Home Living                                          Prior Functioning/Environment              Frequency Min 2X/week     Progress Toward Goals  OT Goals(current goals can now be found in the care plan section)  Progress towards OT goals: Progressing toward goals  Acute Rehab OT Goals Patient Stated Goal: to go home today OT Goal Formulation: With patient  Plan Discharge plan remains appropriate    Co-evaluation                 End of Session Equipment Utilized During Treatment: Back brace   Activity Tolerance Patient tolerated treatment well   Patient Left in chair;with call bell/phone within reach;with nursing/sitter in room   Nurse Communication Other (comment) (pt ready to d/c from OT standpoint)        Time: 0810-0825 OT Time Calculation (min): 15 min  Charges: OT General Charges $OT Visit: 1 Procedure OT Treatments $Self Care/Home Management : 8-22 mins  Gaye Alken M.S., OTR/L Pager: [phone]  07/14/2015, 8:52 AM

## 2015-07-14 NOTE — Progress Notes (Signed)
Pt doing well. Pt and wife given D/C instructions with Rx, verbal understanding was provided. Pt's IV and Hemovac were removed prior to D/C. Pt's incision is clean and dry with no sign of infection. Pt D/C'd home via wheelchair @ 1125 per MD order. Pt is stable @ D/C and has no other needs at this time. Rema FendtAshley Mao Lockner, RN

## 2015-07-14 NOTE — Discharge Instructions (Signed)
No lifting no bending no twisting no driving a riding a car unless he is coming back and forth to see me. Keep the incision clean dry and intact. Remaining remove the outer dressing in 2-3 days leave the Steri-Strips on and intact. Cover the Steri-Strips up with Saran wrap for showers only.  Wound Care Keep incision covered and dry for one week.  If you shower prior to then, cover incision with plastic wrap.  You may remove outer bandage after one week and shower.  Do not put any creams, lotions, or ointments on incision. Leave steri-strips on neck.  They will fall off by themselves. Activity Walk each and every day, increasing distance each day. No lifting greater than 5 lbs.  Avoid bending, arching, or twisting. No driving for 2 weeks; may ride as a passenger locally. If provided with back brace, wear when out of bed.  It is not necessary to wear in bed. Diet Resume your normal diet.  Return to Work Will be discussed at you follow up appointment. Call Your Doctor If Any of These Occur Redness, drainage, or swelling at the wound.  Temperature greater than 101 degrees. Severe pain not relieved by pain medication. Incision starts to come apart. Follow Up Appt Call today for appointment in 1-2 weeks ([phone]) or for problems.  If you have any hardware placed in your spine, you will need an x-ray before your appointment.

## 2015-07-14 NOTE — Progress Notes (Signed)
Patient ID: Jason Kidd, male   DOB: 04/03/1951, 64 y.o.   MRN: [id number] Doing well significant improvement in pain   Strength out of 5 wound clean dry and intact   Discharge

## 2016-07-11 ENCOUNTER — Other Ambulatory Visit: Payer: Self-pay | Admitting: Neurosurgery

## 2016-07-11 DIAGNOSIS — M5137 Other intervertebral disc degeneration, lumbosacral region: Secondary | ICD-10-CM

## 2016-07-16 ENCOUNTER — Ambulatory Visit
Admission: RE | Admit: 2016-07-16 | Discharge: 2016-07-16 | Disposition: A | Discharge status: Home / Self Care | Payer: Medicare Other | Source: Ambulatory Visit | Attending: Neurosurgery | Admitting: Neurosurgery

## 2016-07-16 DIAGNOSIS — M5137 Other intervertebral disc degeneration, lumbosacral region: Secondary | ICD-10-CM

## 2016-07-23 ENCOUNTER — Other Ambulatory Visit: Payer: Self-pay | Admitting: Neurosurgery

## 2016-07-23 DIAGNOSIS — M5137 Other intervertebral disc degeneration, lumbosacral region: Secondary | ICD-10-CM

## 2016-07-24 ENCOUNTER — Ambulatory Visit
Admission: RE | Admit: 2016-07-24 | Discharge: 2016-07-24 | Disposition: A | Discharge status: Home / Self Care | Payer: Medicare Other | Source: Ambulatory Visit | Attending: Neurosurgery | Admitting: Neurosurgery

## 2016-07-24 DIAGNOSIS — M5137 Other intervertebral disc degeneration, lumbosacral region: Secondary | ICD-10-CM

## 2016-07-24 MED ORDER — METHYLPREDNISOLONE ACETATE 40 MG/ML INJ SUSP (RADIOLOG: 120.0000 mg | Freq: Once | INTRAMUSCULAR | Status: DC

## 2016-07-24 MED ORDER — IOPAMIDOL (ISOVUE-M 200) INJECTION 41%: 1.0000 mL | Freq: Once | INTRAMUSCULAR | Status: DC

## 2016-08-01 ENCOUNTER — Encounter: Payer: Self-pay | Admitting: Physical Therapy

## 2016-08-01 ENCOUNTER — Ambulatory Visit: Payer: Medicare Other | Attending: Neurosurgery | Admitting: Physical Therapy

## 2016-08-01 DIAGNOSIS — M62830 Muscle spasm of back: Secondary | ICD-10-CM | POA: Insufficient documentation

## 2016-08-01 DIAGNOSIS — G8929 Other chronic pain: Secondary | ICD-10-CM | POA: Diagnosis present

## 2016-08-01 DIAGNOSIS — M545 Low back pain: Secondary | ICD-10-CM | POA: Insufficient documentation

## 2016-08-01 NOTE — Therapy (Signed)
[facility] [address] [place], [place], [address] Phone: [phone]   Fax:  [phone]  Physical Therapy Evaluation  Patient Details  Name: Jason Kidd MRN: [id number] Date of Birth: 03/04/1951 Referring Provider: Dr. Wynetta Emeryram  Encounter Date: 08/01/2016      PT End of Session - 08/01/16 1346    Visit Number 1   Date for PT Re-Evaluation 10/27/16   PT Start Time 0100   PT Stop Time 0152   PT Time Calculation (min) 52 min   Activity Tolerance Patient tolerated treatment well   Behavior During Therapy [facility] for tasks assessed/performed      Past Medical History:  Diagnosis Date  . Anxiety    clonazepam for sleep   . Arthritis    spondylolisthesis- lumbar, L shoulder, knee- R  . Gout   . H/O renal calculi    lithotripsy- , several times, [facility]- last one Aug. 2016  . History of stress test 2006   told that it was normal- [facility], not sure where  . Hypertension     Past Surgical History:  Procedure Laterality Date  . BACK SURGERY    . TONSILLECTOMY      There were no vitals filed for this visit.       Subjective Assessment - 08/01/16 1306    Subjective Pt reports low back pain. He states he has had 4 back surgeries in the past 10-12 years. Pt states he is unable to play golf due to the back pain. Pt states he has done PT in the past and has seen some benefit from PT however he has never regained his past level of function. He reports a lack of activity due to the back pain and inability to exercise. Pt received an injection in the facet between L5 and S1 last week. He reports immediate relief however since then he has noticed the pain beginning to come back. He is able to walk and walk his dogs and has lost 10 pounds in the last 2 weeks.   Pertinent History kidney stones   How long can you stand comfortably? 5 minutes   How long can you walk comfortably? 30 minutes   Diagnostic  tests Xrays, MRI, CT scan   Patient Stated Goals decrease persistent pain, become more active   Currently in Pain? Yes   Pain Score 3   worst: 10/10   Pain Location Back   Pain Orientation Lower   Pain Descriptors / Indicators Constant   Pain Type Chronic pain   Pain Onset Other (comment)  multiple back surgeries over past 10 years   Pain Frequency Constant   Pain Relieving Factors medications, icy-hot,             [facility] PT Assessment - 08/01/16 0001      Assessment   Medical Diagnosis low back pain   Referring Provider Dr. Wynetta Emeryram   Next MD Visit February 11   Prior Therapy yes     Balance Screen   Has the patient fallen in the past 6 months No   Has the patient had a decrease in activity level because of a fear of falling?  No   Is the patient reluctant to leave their home because of a fear of falling?  No     Prior Function   Level of Independence Independent   Vocation Retired   Leisure walking, golfing,  ROM / Strength   AROM / PROM / Strength AROM;Strength     AROM   Overall AROM Comments P! localized to right posterior hip/low back   AROM Assessment Site Lumbar   Lumbar Flexion WFL   Lumbar Extension WFL, P!   Lumbar - Right Side Bend WFL   Lumbar - Left Side Bend WFL, P! (more pain sb to left than the right)    Lumbar - Right Rotation WFL   Lumbar - Left Rotation [facility]     Strength   Overall Strength Within functional limits for tasks performed   Overall Strength Comments 5/5 hip and knee strength  flexion, abduction, adduction, knee flexion/extension   Strength Assessment Site Knee;Hip     Flexibility   Soft Tissue Assessment /Muscle Length yes   Hamstrings lacking 25 lbs from 180   Piriformis tight                   [facility] Adult PT Treatment/Exercise - 08/01/16 0001      Modalities   Modalities Moist Heat     Moist Heat Therapy   Number Minutes Moist Heat 12 Minutes   Moist Heat Location Lumbar Spine  feet propped on traction  stool                PT Education - 08/01/16 1346    Education provided Yes   Education Details LE stretches   Person(s) Educated Patient   Methods Explanation;Handout   Comprehension Verbalized understanding          PT Short Term Goals - 08/01/16 1405      PT SHORT TERM GOAL #1   Title Pt will be independent with HEP.   Time 1   Period Weeks   Status New           PT Long Term Goals - 08/01/16 1405      PT LONG TERM GOAL #1   Title Pt will report 50% decrease in worst pain rating.   Time 8   Period Weeks   Status New     PT LONG TERM GOAL #2   Title Pt will improve HS 90/90 by 10 degrees in order to dmeonstrate increased flexibility.   Time 8   Period Weeks   Status New     PT LONG TERM GOAL #3   Title Pt will be able to stand and be on his feet for more than 10 minutes wihtout increased symptoms.   Time 8   Period Weeks   Status New               Plan - 08/01/16 1347    Clinical Impression Statement Pt presents with chronic low back pain. Pt tests well for LE strength however he is hypomobile. Pt states that he understands he may see some benefit from losing weight and education was given to explain the positives of weight loss on his joints. Pt was given HEP to focus on LE flexibility. Pt reported that he felt very relaxed and not as tight in the low back after heat modality was applied.   Rehab Potential Good   PT Frequency 2x / week   PT Duration 8 weeks   PT Treatment/Interventions Cryotherapy;Electrical Stimulation;Iontophoresis 4mg /ml Dexamethasone;Moist Heat;Ultrasound;Gait training;Stair training;Functional mobility training;Therapeutic activities;Therapeutic exercise;Balance training;Neuromuscular re-education;Patient/family education;Manual techniques;Passive range of motion   PT Next Visit Plan LE stretches, aerobic activity, core stability, pain management   PT Home Exercise Plan LE stretches   Consulted and  Agree with Plan of  Care Patient      Patient will benefit from skilled therapeutic intervention in order to improve the following deficits and impairments:  Decreased activity tolerance, Decreased mobility, Decreased range of motion, Hypomobility, Impaired flexibility, Increased muscle spasms, Decreased endurance, Postural dysfunction, Obesity, Pain, Improper body mechanics  Visit Diagnosis: Chronic bilateral low back pain without sciatica  Muscle spasm of back     Problem List Patient Active Problem List   Diagnosis Date Noted  . Spinal stenosis of lumbar region 07/12/2015    Tamsen Meek, SPT 08/01/2016, 2:12 PM  [facility] [address] [place], [place], [address] Phone: [phone]   Fax:  [phone]  Name: Jason Kidd MRN: [id number] Date of Birth: 06/24/1951

## 2016-08-06 ENCOUNTER — Encounter: Payer: Self-pay | Admitting: Physical Therapy

## 2016-08-06 ENCOUNTER — Ambulatory Visit: Payer: Medicare Other | Attending: Neurosurgery | Admitting: Physical Therapy

## 2016-08-06 DIAGNOSIS — G8929 Other chronic pain: Secondary | ICD-10-CM | POA: Diagnosis present

## 2016-08-06 DIAGNOSIS — M25512 Pain in left shoulder: Secondary | ICD-10-CM | POA: Insufficient documentation

## 2016-08-06 DIAGNOSIS — M25612 Stiffness of left shoulder, not elsewhere classified: Secondary | ICD-10-CM | POA: Diagnosis present

## 2016-08-06 DIAGNOSIS — M545 Low back pain: Secondary | ICD-10-CM | POA: Diagnosis not present

## 2016-08-06 DIAGNOSIS — M62830 Muscle spasm of back: Secondary | ICD-10-CM | POA: Diagnosis present

## 2016-08-06 NOTE — Therapy (Signed)
[facility] [address] [place], [place], [address] Phone: [phone]   Fax:  [phone]  Physical Therapy Treatment  Patient Details  Name: Jason Kidd MRN: [id number] Date of Birth: 10/02/1950 Referring Provider: Dr. Wynetta Emeryram  Encounter Date: 08/06/2016      PT End of Session - 08/06/16 1308    Visit Number 2   Date for PT Re-Evaluation 10/27/16   PT Start Time 1230   PT Stop Time 1320   PT Time Calculation (min) 50 min      Past Medical History:  Diagnosis Date  . Anxiety    clonazepam for sleep   . Arthritis    spondylolisthesis- lumbar, L shoulder, knee- R  . Gout   . H/O renal calculi    [facility]otri[facility]- , several times, [facility]- last one Aug. 2016  . History of stress test 2006   told that it was normal- done [facility], not sure where  . Hypertension     Past Surgical History:  Procedure Laterality Date  . BACK SURGERY    . TONSILLECTOMY      There were no vitals filed for this visit.      Subjective Assessment - 08/06/16 1234    Subjective stretches helping, doing 3 time per day.    Currently in Pain? Yes   Pain Score 2    Pain Location Back                         [facility] Adult PT Treatment/Exercise - 08/06/16 0001      Exercises   Exercises Lumbar;Knee/Hip     Lumbar Exercises: Aerobic   Stationary Bike Nustep L 6 6 min     Lumbar Exercises: Machines for Strengthening   Cybex Lumbar Extension blue tband 2 sets 15   Other Lumbar Machine Exercise seated row 25# 2 sets 15   Other Lumbar Machine Exercise lat pull 25# 2 sets 15     Lumbar Exercises: Standing   Other Standing Lumbar Exercises 20# cable pulleys scap stab 3 way 15 times each   Other Standing Lumbar Exercises red tband hip ext and abd 15 times each     Lumbar Exercises: Supine   Bridge 15 reps;3 seconds  with ball. KTC and obliques     Modalities   Modalities Moist Heat     Moist  Heat Therapy   Number Minutes Moist Heat 15 Minutes   Moist Heat Location Lumbar Spine     Manual Therapy   Manual Therapy Passive ROM   Passive ROM LE and trunk                  PT Short Term Goals - 08/01/16 1405      PT SHORT TERM GOAL #1   Title Pt will be independent with HEP.   Time 1   Period Weeks   Status New           PT Long Term Goals - 08/01/16 1405      PT LONG TERM GOAL #1   Title Pt will report 50% decrease in worst pain rating.   Time 8   Period Weeks   Status New     PT LONG TERM GOAL #2   Title Pt will improve HS 90/90 by 10 degrees in order to dmeonstrate increased flexibility.   Time 8   Period Weeks   Status  New     PT LONG TERM GOAL #3   Title Pt will be able to stand and be on his feet for more than 10 minutes wihtout increased symptoms.   Time 8   Period Weeks   Status New               Plan - 08/06/16 1308    Clinical Impression Statement pt with cramp in left hip with ex, cuing for posture with ther ex. very tight with PROM. MH after session to relax muscles- pt reports using TENS at home   PT Next Visit Plan LE stretches, aerobic activity, core stability, pain management      Patient will benefit from skilled therapeutic intervention in order to improve the following deficits and impairments:  Decreased activity tolerance, Decreased mobility, Decreased range of motion, Hypomobility, Impaired flexibility, Increased muscle spasms, Decreased endurance, Postural dysfunction, Obesity, Pain, Improper body mechanics  Visit Diagnosis: Chronic bilateral low back pain without sciatica  Muscle spasm of back  Decreased ROM of left shoulder     Problem List Patient Active Problem List   Diagnosis Date Noted  . Spinal stenosis of lumbar region 07/12/2015    Tamika Nou,ANGIE PTA 08/06/2016, 1:10 PM  [facility] [place] [facility] [address] [place], [place], [address] Phone:  [phone]   Fax:  [phone]  Name: Jason Kidd MRN: [id number] Date of Birth: June 09, 1951

## 2016-08-08 ENCOUNTER — Encounter: Payer: Self-pay | Admitting: Physical Therapy

## 2016-08-08 ENCOUNTER — Ambulatory Visit: Payer: Medicare Other | Admitting: Physical Therapy

## 2016-08-08 DIAGNOSIS — M545 Low back pain, unspecified: Secondary | ICD-10-CM

## 2016-08-08 DIAGNOSIS — M25612 Stiffness of left shoulder, not elsewhere classified: Secondary | ICD-10-CM

## 2016-08-08 DIAGNOSIS — M62830 Muscle spasm of back: Secondary | ICD-10-CM

## 2016-08-08 DIAGNOSIS — G8929 Other chronic pain: Secondary | ICD-10-CM

## 2016-08-08 NOTE — Therapy (Signed)
[facility] [place] [facility] [address] [place], [place], [address] Phone: [phone]   Fax:  [phone]  Physical Therapy Treatment  Patient Details  Name: Jason Kidd MRN: [id number] Date of Birth: Jun 13, 1951 Referring Provider: Dr. Wynetta Emery  Encounter Date: 08/08/2016      PT End of Session - 08/08/16 1144    Visit Number 3   Date for PT Re-Evaluation 10/27/16   PT Start Time 1100   PT Stop Time 1200   PT Time Calculation (min) 60 min      Past Medical History:  Diagnosis Date  . Anxiety    clonazepam for sleep   . Arthritis    spondylolisthesis- lumbar, L shoulder, knee- R  . Gout   . H/O renal calculi    lithotripsy- , several times, done in [facility]- last one Aug. 2016  . History of stress test 2006   told that it was normal- done in [facility], not sure where  . Hypertension     Past Surgical History:  Procedure Laterality Date  . BACK SURGERY    . TONSILLECTOMY      There were no vitals filed for this visit.      Subjective Assessment - 08/08/16 1104    Subjective sore after last session, stretching at home really helping   Currently in Pain? Yes   Pain Score 2    Pain Location Back                         [facility] Adult PT Treatment/Exercise - 08/08/16 0001      Lumbar Exercises: Aerobic   Elliptical 2 fwd/2 back I 10 R 6     Lumbar Exercises: Machines for Strengthening   Cybex Lumbar Extension 35# 2 sets 15  10# pulley rotation 15 times each side   Other Lumbar Machine Exercise seated row 25# 2 sets 15   Other Lumbar Machine Exercise lat pull 25# 2 sets 15     Lumbar Exercises: Standing   Heel Raises 20 reps;3 seconds  black bar   Other Standing Lumbar Exercises 6# dead lifts 2 sets 10     Knee/Hip Exercises: Standing   Other Standing Knee Exercises red tband 15 times hip ext and abd     Moist Heat Therapy   Number Minutes Moist Heat 15 Minutes   Moist Heat Location  Lumbar Spine                  PT Short Term Goals - 08/08/16 1143      PT SHORT TERM GOAL #1   Title Pt will be independent with HEP.   Status Achieved           PT Long Term Goals - 08/01/16 1405      PT LONG TERM GOAL #1   Title Pt will report 50% decrease in worst pain rating.   Time 8   Period Weeks   Status New     PT LONG TERM GOAL #2   Title Pt will improve HS 90/90 by 10 degrees in order to dmeonstrate increased flexibility.   Time 8   Period Weeks   Status New     PT LONG TERM GOAL #3   Title Pt will be able to stand and be on his feet for more than 10 minutes wihtout increased symptoms.   Time 8   Period Weeks   Status New  Patient will benefit from skilled therapeutic intervention in order to improve the following deficits and impairments:     Visit Diagnosis: Chronic bilateral low back pain without sciatica  Muscle spasm of back  Decreased ROM of left shoulder     Problem List Patient Active Problem List   Diagnosis Date Noted  . Spinal stenosis of lumbar region 07/12/2015    Kymberlyn Eckford,ANGIE  PTA 08/08/2016, 11:46 AM  [facility] [address] [place], [place], [address] Phone: [phone]   Fax:  [phone]  Name: Jason Kidd MRN: [id number] Date of Birth: 01/24/1951

## 2016-08-12 ENCOUNTER — Ambulatory Visit: Payer: Medicare Other | Admitting: Physical Therapy

## 2016-08-12 ENCOUNTER — Encounter: Payer: Self-pay | Admitting: Physical Therapy

## 2016-08-12 DIAGNOSIS — G8929 Other chronic pain: Secondary | ICD-10-CM

## 2016-08-12 DIAGNOSIS — M62830 Muscle spasm of back: Secondary | ICD-10-CM

## 2016-08-12 DIAGNOSIS — M25612 Stiffness of left shoulder, not elsewhere classified: Secondary | ICD-10-CM

## 2016-08-12 DIAGNOSIS — M545 Low back pain: Secondary | ICD-10-CM | POA: Diagnosis not present

## 2016-08-12 NOTE — Therapy (Signed)
[facility] [place] [facility] [address] [place], [place], [address] Phone: [phone]   Fax:  [phone]  Physical Therapy Treatment  Patient Details  Name: Jason Kidd MRN: [id number] Date of Birth: 17-Apr-1951 Referring Provider: Dr. Wynetta Emery  Encounter Date: 08/12/2016      PT End of Session - 08/12/16 1144    Visit Number 4   Date for PT Re-Evaluation 10/27/16   PT Start Time 1100   PT Stop Time 1145   PT Time Calculation (min) 45 min   Activity Tolerance Patient tolerated treatment well   Behavior During Therapy [facility] for tasks assessed/performed      Past Medical History:  Diagnosis Date  . Anxiety    clonazepam for sleep   . Arthritis    spondylolisthesis- lumbar, L shoulder, knee- R  . Gout   . H/O renal calculi    lithotripsy- , several times, done in [facility]- last one Aug. 2016  . History of stress test 2006   told that it was normal- done in [facility], not sure where  . Hypertension     Past Surgical History:  Procedure Laterality Date  . BACK SURGERY    . TONSILLECTOMY      There were no vitals filed for this visit.      Subjective Assessment - 08/12/16 1100    Subjective Pt states he is doing great. He reports no pain and is pleased with the combination of physical therapy and injection.   Currently in Pain? No/denies                         [facility] Adult PT Treatment/Exercise - 08/12/16 0001      Lumbar Exercises: Aerobic   Stationary Bike NuStep lvl 6 , 6 minutes     Lumbar Exercises: Machines for Strengthening   Leg Press 60 lb, 2x15   Other Lumbar Machine Exercise Seated row 35 lb 2x20, lat pulldown 25 lb x15   Other Lumbar Machine Exercise shoulder pulldown (extension) 20 lb x20,  35 lb x15     Lumbar Exercises: Standing   Other Standing Lumbar Exercises lumbar rotation with blue weight ball in seated position. x10 echa way     Manual Therapy   Manual Therapy Passive  ROM   Passive ROM Piriformis, HS, gastroc, SKC, ITB, quad stretches                PT Education - 08/12/16 1144    Education provided No          PT Short Term Goals - 08/08/16 1143      PT SHORT TERM GOAL #1   Title Pt will be independent with HEP.   Status Achieved           PT Long Term Goals - 08/12/16 1147      PT LONG TERM GOAL #1   Status On-going     PT LONG TERM GOAL #2   Status On-going     PT LONG TERM GOAL #3   Status On-going               Plan - 08/12/16 1144    Clinical Impression Statement Pt tolerated treatment well and was able to complete all exercises without increased pain. He states he did feel some "tightness" with lumbar rotation however no pain. Progress per pt tolerance.   Rehab Potential Good   PT Frequency 2x /  week   PT Duration 8 weeks   PT Treatment/Interventions Cryotherapy;Electrical Stimulation;Iontophoresis 4mg /ml Dexamethasone;Moist Heat;Ultrasound;Gait training;Stair training;Functional mobility training;Therapeutic activities;Therapeutic exercise;Balance training;Neuromuscular re-education;Patient/family education;Manual techniques;Passive range of motion   PT Next Visit Plan LE stretches, aerobic activity, core stability, pain management   Consulted and Agree with Plan of Care Patient      Patient will benefit from skilled therapeutic intervention in order to improve the following deficits and impairments:  Decreased activity tolerance, Decreased mobility, Decreased range of motion, Hypomobility, Impaired flexibility, Increased muscle spasms, Decreased endurance, Postural dysfunction, Obesity, Pain, Improper body mechanics  Visit Diagnosis: Muscle spasm of back  Chronic bilateral low back pain without sciatica  Decreased ROM of left shoulder     Problem List Patient Active Problem List   Diagnosis Date Noted  . Spinal stenosis of lumbar region 07/12/2015    Tamsen MeekBrian Cade Avyay Coger, SPT 08/12/2016, 11:48  AM  [facility] [address] [place], [place], [address] Phone: [phone]   Fax:  [phone]  Name: Jason Kidd MRN: [id number] Date of Birth: 01/07/1951

## 2016-08-16 ENCOUNTER — Ambulatory Visit: Payer: Medicare Other | Admitting: Physical Therapy

## 2016-08-19 ENCOUNTER — Ambulatory Visit: Payer: Medicare Other | Admitting: Physical Therapy

## 2016-08-19 DIAGNOSIS — M545 Low back pain: Secondary | ICD-10-CM | POA: Diagnosis not present

## 2016-08-19 DIAGNOSIS — G8929 Other chronic pain: Secondary | ICD-10-CM

## 2016-08-19 DIAGNOSIS — M25512 Pain in left shoulder: Secondary | ICD-10-CM

## 2016-08-19 DIAGNOSIS — M62830 Muscle spasm of back: Secondary | ICD-10-CM

## 2016-08-19 DIAGNOSIS — M25612 Stiffness of left shoulder, not elsewhere classified: Secondary | ICD-10-CM

## 2016-08-19 NOTE — Therapy (Signed)
[facility] [address] [place], [place], [address] Phone: [phone]   Fax:  [phone]  Physical Therapy Treatment  Patient Details  Name: Jason CraneWilliam F Kidd MRN: [id number] Date of Birth: 01/29/1951 Referring Provider: Dr. Wynetta Emeryram  Encounter Date: 08/19/2016      PT End of Session - 08/19/16 1145    Visit Number 5   Date for PT Re-Evaluation 10/27/16   PT Start Time 1100   PT Stop Time 1154   PT Time Calculation (min) 54 min   Activity Tolerance Patient tolerated treatment well   Behavior During Therapy [facility] for tasks assessed/performed      Past Medical History:  Diagnosis Date  . Anxiety    clonazepam for sleep   . Arthritis    spondylolisthesis- lumbar, L shoulder, knee- R  . Gout   . H/O renal calculi    lithotripsy- , several times, [facility]- last one Aug. 2016  . History of stress test 2006   told that it was normal- [facility], not sure where  . Hypertension     Past Surgical History:  Procedure Laterality Date  . BACK SURGERY    . TONSILLECTOMY      There were no vitals filed for this visit.      Subjective Assessment - 08/19/16 1101    Subjective "Pretty good, a little bit stiff, a little bit sore, but no acute pain." "As cold as it's been I really miss walking."   Currently in Pain? No/denies   Pain Score 0-No pain                         [facility] Adult PT Treatment/Exercise - 08/19/16 0001      Lumbar Exercises: Stretches   Passive Hamstring Stretch 5 reps;10 seconds   Single Knee to Chest Stretch 2 reps;10 seconds   Piriformis Stretch 3 reps;10 seconds     Lumbar Exercises: Aerobic   Stationary Bike NuStep lvl 6 , 6 minutes     Lumbar Exercises: Machines for Strengthening   Cybex Knee Flexion 35lb 2x15   Leg Press 60 lb, 3x10   Other Lumbar Machine Exercise Seated row 35 lb 2x25, lat pulldown 25 lb x15   Other Lumbar Machine Exercise shoulder  pulldown (extension) 35 lb 2x15     Lumbar Exercises: Standing   Other Standing Lumbar Exercises lumbar rotation with blue weight ball in seated position. x10 each way   Other Standing Lumbar Exercises overhead ext blue ball 2x10      Modalities   Modalities Moist Heat     Moist Heat Therapy   Number Minutes Moist Heat 15 Minutes   Moist Heat Location Lumbar Spine                  PT Short Term Goals - 08/08/16 1143      PT SHORT TERM GOAL #1   Title Pt will be independent with HEP.   Status Achieved           PT Long Term Goals - 08/19/16 1147      PT LONG TERM GOAL #1   Title Pt will report 50% decrease in worst pain rating.   Status On-going     PT LONG TERM GOAL #2   Title Pt will improve HS 90/90 by 10 degrees in order to dmeonstrate increased flexibility.   Status On-going  PT LONG TERM GOAL #3   Title Pt will be able to stand and be on his feet for more than 10 minutes wihtout increased symptoms.   Status On-going               Plan - 08/19/16 1145    Clinical Impression Statement Pt again tolerated all exercises well. Does report some tightness in low back on the R side. Does reports some increase low back pain with HS with RLE.   Rehab Potential Good   PT Frequency 2x / week   PT Treatment/Interventions Cryotherapy;Electrical Stimulation;Iontophoresis 4mg /ml Dexamethasone;Moist Heat;Ultrasound;Gait training;Stair training;Functional mobility training;Therapeutic activities;Therapeutic exercise;Balance training;Neuromuscular re-education;Patient/family education;Manual techniques;Passive range of motion   PT Next Visit Plan LE stretches, aerobic activity, core stability, pain management      Patient will benefit from skilled therapeutic intervention in order to improve the following deficits and impairments:  Decreased activity tolerance, Decreased mobility, Decreased range of motion, Hypomobility, Impaired flexibility, Increased muscle  spasms, Decreased endurance, Postural dysfunction, Obesity, Pain, Improper body mechanics  Visit Diagnosis: Muscle spasm of back  Chronic bilateral low back pain without sciatica  Decreased ROM of left shoulder  Chronic left shoulder pain     Problem List Patient Active Problem List   Diagnosis Date Noted  . Spinal stenosis of lumbar region 07/12/2015    Grayce Sessions, PTA 08/19/2016, 11:48 AM  [facility] [place] [facility] [address] [place], [place], [address] Phone: [phone]   Fax:  [phone]  Name: Jason Kidd MRN: [id number] Date of Birth: 1950-10-02

## 2016-08-22 ENCOUNTER — Ambulatory Visit: Payer: Medicare Other | Admitting: Physical Therapy

## 2016-08-23 ENCOUNTER — Encounter: Payer: Self-pay | Admitting: Physical Therapy

## 2016-08-23 ENCOUNTER — Ambulatory Visit: Payer: Medicare Other | Admitting: Physical Therapy

## 2016-08-23 DIAGNOSIS — M62830 Muscle spasm of back: Secondary | ICD-10-CM

## 2016-08-23 DIAGNOSIS — M545 Low back pain, unspecified: Secondary | ICD-10-CM

## 2016-08-23 DIAGNOSIS — G8929 Other chronic pain: Secondary | ICD-10-CM

## 2016-08-23 NOTE — Therapy (Signed)
[facility] [place] [facility] [address] [place], [place], [address] Phone: [phone]   Fax:  [phone]  Physical Therapy Treatment  Patient Details  Name: WINSTON MISNER MRN: [id number] Date of Birth: [birth year] Referring Provider: Dr. Wynetta Emery  Encounter Date: 08/23/2016      PT End of Session - 08/23/16 1147    Visit Number 6   Date for PT Re-Evaluation 10/27/16   PT Start Time 1106   PT Stop Time 1200   PT Time Calculation (min) 54 min      Past Medical History:  Diagnosis Date  . Anxiety    clonazepam for sleep   . Arthritis    spondylolisthesis- lumbar, L shoulder, knee- R  . Gout   . H/O renal calculi    lithotripsy- , several times, done in [facility]- last one Aug. 2016  . History of stress test 2006   told that it was normal- done [facility], not sure where  . Hypertension     Past Surgical History:  Procedure Laterality Date  . BACK SURGERY    . TONSILLECTOMY      There were no vitals filed for this visit.      Subjective Assessment - 08/23/16 1108    Subjective back is okay, knee is really bad, stretching daily helps the most   Currently in Pain? Yes   Pain Score 2    Pain Location Back   Pain Orientation Lower   Pain Descriptors / Indicators Tightness                         [facility] Adult PT Treatment/Exercise - 08/23/16 0001      Lumbar Exercises: Aerobic   Stationary Bike NuStep lvl 6 , 6 minutes     Lumbar Exercises: Machines for Strengthening   Cybex Lumbar Extension black tband 2 sets 15   Other Lumbar Machine Exercise Seated row 35 lb 2x 15, lat pulldown 25 lb 2 sets x15   Other Lumbar Machine Exercise shoulder pulldown (extension) 25 lb 2x15  35 too much wt today     Lumbar Exercises: Standing   Other Standing Lumbar Exercises wt ball rotation 15 times each way   Other Standing Lumbar Exercises mod 6# dead lift 2 sets 10     Moist Heat Therapy   Number Minutes  Moist Heat 15 Minutes   Moist Heat Location Lumbar Spine                  PT Short Term Goals - 08/08/16 1143      PT SHORT TERM GOAL #1   Title Pt will be independent with HEP.   Status Achieved           PT Long Term Goals - 08/23/16 1149      PT LONG TERM GOAL #1   Title Pt will report 50% decrease in worst pain rating.   Status On-going     PT LONG TERM GOAL #2   Title Pt will improve HS 90/90 by 10 degrees in order to dmeonstrate increased flexibility.   Status On-going     PT LONG TERM GOAL #3   Title Pt will be able to stand and be on his feet for more than 10 minutes wihtout increased symptoms.   Status Achieved               Plan - 08/23/16 1147  Clinical Impression Statement pt tolerated ther ex well, stiffness in LB but no pain, limited knee ex d/t pain. progressing with goals   PT Next Visit Plan core stab      Patient will benefit from skilled therapeutic intervention in order to improve the following deficits and impairments:  Decreased activity tolerance, Decreased mobility, Decreased range of motion, Hypomobility, Impaired flexibility, Increased muscle spasms, Decreased endurance, Postural dysfunction, Obesity, Pain, Improper body mechanics  Visit Diagnosis: Muscle spasm of back  Chronic bilateral low back pain without sciatica     Problem List Patient Active Problem List   Diagnosis Date Noted  . Spinal stenosis of lumbar region 07/12/2015    Desjuan Stearns,ANGIE  PTA 08/23/2016, 11:50 AM  [facility] [place] [address] [place], [place], [address] Phone: [phone]   Fax:  [phone]  Name: Gershon CraneWilliam F Grilli MRN: [id number] Date of Birth: 09/06/1950

## 2016-08-27 ENCOUNTER — Ambulatory Visit: Payer: Medicare Other | Admitting: Physical Therapy

## 2016-08-27 ENCOUNTER — Encounter: Payer: Self-pay | Admitting: Physical Therapy

## 2016-08-27 DIAGNOSIS — M545 Low back pain, unspecified: Secondary | ICD-10-CM

## 2016-08-27 DIAGNOSIS — G8929 Other chronic pain: Secondary | ICD-10-CM

## 2016-08-27 DIAGNOSIS — M62830 Muscle spasm of back: Secondary | ICD-10-CM

## 2016-08-27 NOTE — Therapy (Signed)
[facility] [place] [address] [place], [place], [address] Phone: [phone]   Fax:  [phone]  Physical Therapy Treatment  Patient Details  Name: Jason Kidd MRN: [id number] Date of Birth: 1951-02-08 Referring Provider: Dr. Wynetta Emery  Encounter Date: 08/27/2016      PT End of Session - 08/27/16 1612    Visit Number 7   Date for PT Re-Evaluation 10/27/16   PT Start Time 1443   PT Stop Time 1530   PT Time Calculation (min) 47 min   Activity Tolerance Patient tolerated treatment well   Behavior During Therapy [facility] for tasks assessed/performed      Past Medical History:  Diagnosis Date  . Anxiety    clonazepam for sleep   . Arthritis    spondylolisthesis- lumbar, L shoulder, knee- R  . Gout   . H/O renal calculi    lithotripsy- , several times, done in [facility]- last one Aug. 2016  . History of stress test 2006   told that it was normal- done in [facility], not sure where  . Hypertension     Past Surgical History:  Procedure Laterality Date  . BACK SURGERY    . TONSILLECTOMY      There were no vitals filed for this visit.      Subjective Assessment - 08/27/16 1446    Subjective I am pretty good right now, I am about 30% better overall, the stretching helps   Currently in Pain? Yes   Pain Score 2    Pain Location Back   Pain Orientation Lower   Aggravating Factors  getting in and out of the car                         [facility] Adult PT Treatment/Exercise - 08/27/16 0001      Lumbar Exercises: Stretches   Passive Hamstring Stretch 4 reps;20 seconds   Single Knee to Chest Stretch 2 reps;10 seconds   Quad Stretch 4 reps;20 seconds   Piriformis Stretch 4 reps;20 seconds     Lumbar Exercises: Aerobic   Stationary Bike NuStep lvl 6 , 6 minutes     Lumbar Exercises: Machines for Strengthening   Cybex Lumbar Extension black tband 2 sets 15   Other Lumbar Machine Exercise Seated row 35  lb 2x 15, lat pulldown 25 lb 2 sets x15   Other Lumbar Machine Exercise shoulder pulldown (extension) 25 lb 2x15, 5# hip extension and abduction     Lumbar Exercises: Standing   Other Standing Lumbar Exercises wt ball rotation 15 times each way                  PT Short Term Goals - 08/08/16 1143      PT SHORT TERM GOAL #1   Title Pt will be independent with HEP.   Status Achieved           PT Long Term Goals - 08/23/16 1149      PT LONG TERM GOAL #1   Title Pt will report 50% decrease in worst pain rating.   Status On-going     PT LONG TERM GOAL #2   Title Pt will improve HS 90/90 by 10 degrees in order to dmeonstrate increased flexibility.   Status On-going     PT LONG TERM GOAL #3   Title Pt will be able to stand and be on his feet for more than 10  minutes wihtout increased symptoms.   Status Achieved               Plan - 08/27/16 1612    Clinical Impression Statement Had some cramping in the hips after the hip extension and the abduction.  Had tightness of the hip flexor and the quad   PT Next Visit Plan core stab   Consulted and Agree with Plan of Care Patient      Patient will benefit from skilled therapeutic intervention in order to improve the following deficits and impairments:  Decreased activity tolerance, Decreased mobility, Decreased range of motion, Hypomobility, Impaired flexibility, Increased muscle spasms, Decreased endurance, Postural dysfunction, Obesity, Pain, Improper body mechanics  Visit Diagnosis: Muscle spasm of back  Chronic bilateral low back pain without sciatica     Problem List Patient Active Problem List   Diagnosis Date Noted  . Spinal stenosis of lumbar region 07/12/2015    Jearld LeschALBRIGHT,Aviva Wolfer W., PT 08/27/2016, 4:22 PM  [facility] [place] [facility] [address] [place], [place], [address] Phone: [phone]   Fax:  [phone]  Name: Jason Kidd MRN:  [id number] Date of Birth: 04/02/1951

## 2016-08-30 ENCOUNTER — Encounter: Payer: Self-pay | Admitting: Physical Therapy

## 2016-08-30 ENCOUNTER — Ambulatory Visit: Payer: Medicare Other | Admitting: Physical Therapy

## 2016-08-30 DIAGNOSIS — M545 Low back pain, unspecified: Secondary | ICD-10-CM

## 2016-08-30 DIAGNOSIS — G8929 Other chronic pain: Secondary | ICD-10-CM

## 2016-08-30 DIAGNOSIS — M62830 Muscle spasm of back: Secondary | ICD-10-CM

## 2016-08-30 DIAGNOSIS — M25612 Stiffness of left shoulder, not elsewhere classified: Secondary | ICD-10-CM

## 2016-08-30 NOTE — Therapy (Signed)
[facility] [place] [address] [place], [place], [address] Phone: [phone]   Fax:  [phone]  Physical Therapy Treatment  Patient Details  Name: Jason Kidd MRN: [id number] Date of Birth: October 14, 1950 Referring Provider: Dr. Wynetta Emery  Encounter Date: 08/30/2016      PT End of Session - 08/30/16 1144    Visit Number 8   Date for PT Re-Evaluation 10/27/16   PT Start Time 1105   PT Stop Time 1155   PT Time Calculation (min) 50 min   Activity Tolerance Patient tolerated treatment well   Behavior During Therapy [facility] for tasks assessed/performed      Past Medical History:  Diagnosis Date  . Anxiety    clonazepam for sleep   . Arthritis    spondylolisthesis- lumbar, L shoulder, knee- R  . Gout   . H/O renal calculi    lithotripsy- , several times, done in [facility]- last one Aug. 2016  . History of stress test 2006   told that it was normal- done in [facility], not sure where  . Hypertension     Past Surgical History:  Procedure Laterality Date  . BACK SURGERY    . TONSILLECTOMY      There were no vitals filed for this visit.      Subjective Assessment - 08/30/16 1107    Subjective "Same stuff I get up the morning and do my stretches, Got my knee shot up wednesday, I go back Monday for another shot. But  have been walking the dog to help with that."   Currently in Pain? No/denies   Pain Score 0-No pain                         [facility] Adult PT Treatment/Exercise - 08/30/16 0001      Lumbar Exercises: Aerobic   Stationary Bike NuStep lvl 6 , 6 minutes     Lumbar Exercises: Machines for Strengthening   Cybex Lumbar Extension black tband 2 sets 15   Cybex Knee Flexion 35lb 2x15   Other Lumbar Machine Exercise Seated row 35 lb 2x 15, lat pulldown 25 lb 2 sets x15   Other Lumbar Machine Exercise shoulder pulldown (extension) 25 lb 2x15,     Lumbar Exercises: Standing   Other Standing  Lumbar Exercises March 5lb x10; Standing AR press 25lb x10 each   Other Standing Lumbar Exercises Hip ext 5lb 2x10     Modalities   Modalities Moist Heat     Moist Heat Therapy   Number Minutes Moist Heat 10 Minutes   Moist Heat Location Lumbar Spine                  PT Short Term Goals - 08/08/16 1143      PT SHORT TERM GOAL #1   Title Pt will be independent with HEP.   Status Achieved           PT Long Term Goals - 08/30/16 1108      PT LONG TERM GOAL #1   Title Pt will report 50% decrease in worst pain rating.   Status Achieved     PT LONG TERM GOAL #3   Title Pt will be able to stand and be on his feet for more than 10 minutes wihtout increased symptoms.   Status Achieved               Plan - 08/30/16  1145    Clinical Impression Statement Pt able to tolerated some anti rotational core strengthening exercises without any reports of increase pain. During standing march with 5lb cuff weights pt reports a pulling sensation in his low back. Also with standing march pt with more posterior pelvic tilt when elevation LLE. Good strength and ROM with lat pull downs and rows.   Rehab Potential Good   PT Frequency 2x / week   PT Duration 8 weeks   PT Treatment/Interventions Cryotherapy;Electrical Stimulation;Iontophoresis 4mg /ml Dexamethasone;Moist Heat;Ultrasound;Gait training;Stair training;Functional mobility training;Therapeutic activities;Therapeutic exercise;Balance training;Neuromuscular re-education;Patient/family education;Manual techniques;Passive range of motion   PT Next Visit Plan core stab      Patient will benefit from skilled therapeutic intervention in order to improve the following deficits and impairments:  Decreased activity tolerance, Decreased mobility, Decreased range of motion, Hypomobility, Impaired flexibility, Increased muscle spasms, Decreased endurance, Postural dysfunction, Obesity, Pain, Improper body mechanics  Visit  Diagnosis: Muscle spasm of back  Chronic bilateral low back pain without sciatica  Decreased ROM of left shoulder     Problem List Patient Active Problem List   Diagnosis Date Noted  . Spinal stenosis of lumbar region 07/12/2015    Grayce Sessionsonald G [place] Severs, PTA 08/30/2016, 11:48 AM  [facility] [place] [address] [place], [place], [address] Phone: [phone]   Fax:  [phone]  Name: Jason Kidd MRN: [id number] Date of Birth: 04/08/1951

## 2016-09-03 ENCOUNTER — Ambulatory Visit: Payer: Medicare Other | Admitting: Physical Therapy

## 2016-09-03 ENCOUNTER — Encounter: Payer: Self-pay | Admitting: Physical Therapy

## 2016-09-03 DIAGNOSIS — G8929 Other chronic pain: Secondary | ICD-10-CM

## 2016-09-03 DIAGNOSIS — M545 Low back pain: Secondary | ICD-10-CM | POA: Diagnosis not present

## 2016-09-03 DIAGNOSIS — M62830 Muscle spasm of back: Secondary | ICD-10-CM

## 2016-09-03 DIAGNOSIS — M25612 Stiffness of left shoulder, not elsewhere classified: Secondary | ICD-10-CM

## 2016-09-03 DIAGNOSIS — M25512 Pain in left shoulder: Secondary | ICD-10-CM

## 2016-09-03 NOTE — Therapy (Signed)
[facility] [place] [facility] [address] [place], [place], [address] Phone: [phone]   Fax:  [phone]  Physical Therapy Treatment  Patient Details  Name: Jason Kidd MRN: [id number] Date of Birth: 11/27/1950 Referring Provider: Dr. Wynetta Emery  Encounter Date: 09/03/2016      PT End of Session - 09/03/16 1429    Visit Number 9   Date for PT Re-Evaluation 10/27/16   PT Start Time 1345   PT Stop Time 1437   PT Time Calculation (min) 52 min   Activity Tolerance Patient tolerated treatment well   Behavior During Therapy [facility] for tasks assessed/performed      Past Medical History:  Diagnosis Date  . Anxiety    clonazepam for sleep   . Arthritis    spondylolisthesis- lumbar, L shoulder, knee- R  . Gout   . H/O renal calculi    lithotripsy- , several times, done in [facility]- last one Aug. 2016  . History of stress test 2006   told that it was normal- done in [facility], not sure where  . Hypertension     Past Surgical History:  Procedure Laterality Date  . BACK SURGERY    . TONSILLECTOMY      There were no vitals filed for this visit.      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 Pt reports having a fall Friday afternoon while he was out blowing leaves. Pt reports that he did not seek medical attention. Pt reports only a bruise.   Currently in Pain? No/denies   Pain Score 0-No pain                         [facility] Adult PT Treatment/Exercise - 09/03/16 0001      Lumbar Exercises: Aerobic   Stationary Bike NuStep lvl 6 , 6 minutes     Lumbar Exercises: Machines for Strengthening   Cybex Lumbar Extension black tband 2 sets 15   Cybex Knee Flexion 45lb 2x15   Leg Press 60 lb, 3x10   Other Lumbar Machine Exercise Seated row 45 lb 2x 15, lat pulldown 25 lb 2 sets x15   Other Lumbar Machine Exercise shoulder pulldown (extension) 25 lb 2x15,      Lumbar Exercises: Standing   Other Standing Lumbar Exercises Overhead lubar ext yellow ball 2x10    Other Standing Lumbar Exercises Hip ext 5lb 2x10     Modalities   Modalities Moist Heat     Moist Heat Therapy   Number Minutes Moist Heat 10 Minutes   Moist Heat Location Lumbar Spine                  PT Short Term Goals - 08/08/16 1143      PT SHORT TERM GOAL #1   Title Pt will be independent with HEP.   Status Achieved           PT Long Term Goals - 09/03/16 1347      PT LONG TERM GOAL #1   Title Pt will report 50% decrease in worst pain rating.   Status Achieved     PT LONG TERM GOAL #2   Title Pt will improve HS 90/90 by 10 degrees in order to dmeonstrate increased flexibility.   Status On-going     PT LONG TERM GOAL #3   Title Pt will be able to  stand and be on his feet for more than 10 minutes wihtout increased symptoms.   Status Achieved               Plan - 09/03/16 1433    Clinical Impression Statement Pt continues to do well overall, tolerated all interventions well. Reports that he will be going on a trip and for a few days. Informed pt to schedule one more appointment when he gets back to assess how he does on his own.   Rehab Potential Good   PT Frequency 2x / week   PT Duration 8 weeks   PT Next Visit Plan D/C next visit      Patient will benefit from skilled therapeutic intervention in order to improve the following deficits and impairments:  Decreased activity tolerance, Decreased mobility, Decreased range of motion, Hypomobility, Impaired flexibility, Increased muscle spasms, Decreased endurance, Postural dysfunction, Obesity, Pain, Improper body mechanics  Visit Diagnosis: Muscle spasm of back  Chronic bilateral low back pain without sciatica  Chronic left shoulder pain  Decreased ROM of left shoulder     Problem List Patient Active Problem List   Diagnosis Date Noted  . Spinal stenosis of lumbar region 07/12/2015     Grayce Sessionsonald G Joslyne Marshburn, PTA 09/03/2016, 2:36 PM  [facility] [place] [address] [place], [place], [address] Phone: [phone]   Fax:  [phone]  Name: Gershon CraneWilliam F Meininger MRN: [id number] Date of Birth: 08/05/1951

## 2016-09-16 ENCOUNTER — Ambulatory Visit: Payer: Medicare Other | Admitting: Physical Therapy

## 2016-09-16 ENCOUNTER — Ambulatory Visit: Payer: Medicare Other | Attending: Neurosurgery | Admitting: Physical Therapy

## 2016-09-16 ENCOUNTER — Encounter: Payer: Self-pay | Admitting: Physical Therapy

## 2016-09-16 DIAGNOSIS — G8929 Other chronic pain: Secondary | ICD-10-CM | POA: Diagnosis present

## 2016-09-16 DIAGNOSIS — M62830 Muscle spasm of back: Secondary | ICD-10-CM | POA: Insufficient documentation

## 2016-09-16 DIAGNOSIS — M545 Low back pain, unspecified: Secondary | ICD-10-CM

## 2016-09-16 DIAGNOSIS — M25512 Pain in left shoulder: Secondary | ICD-10-CM | POA: Insufficient documentation

## 2016-09-16 NOTE — Therapy (Signed)
[place], [place], [address] Phone: [phone]   Fax:  [phone]  Physical Therapy Treatment  Patient Details  Name: Jason Kidd MRN: [id number] Date of Birth: Sep 02, 1950 Referring Provider: Dr. Saintclair Halsted  Encounter Date: 09/16/2016      PT End of Session - 09/16/16 1339    Visit Number 10   Date for PT Re-Evaluation 10/27/16   PT Start Time 1300   PT Stop Time 1349   PT Time Calculation (min) 49 min   Activity Tolerance Patient tolerated treatment well   Behavior During Therapy [facility] for tasks assessed/performed      Past Medical History:  Diagnosis Date  . Anxiety    clonazepam for sleep   . Arthritis    spondylolisthesis- lumbar, L shoulder, knee- R  . Gout   . H/O renal calculi    lithotripsy- , several times, done in [facility]- last one Aug. 2016  . History of stress test 2006   told that it was normal- done in [facility], not sure where  . Hypertension     Past Surgical History:  Procedure Laterality Date  . BACK SURGERY    . TONSILLECTOMY      There were no vitals filed for this visit.      Subjective Assessment - 09/16/16 1301    Subjective Feeling much better and met with the neurosurgeon this morning. Over all doing a lot better and has lost ten pounds.    Currently in Pain? No/denies                         [facility] Adult PT Treatment/Exercise - 09/16/16 0001      Lumbar Exercises: Aerobic   Stationary Bike NuStep lvl 6 , 6 minutes     Lumbar Exercises: Machines for Strengthening   Cybex Lumbar Extension black tband 2 sets 15   Cybex Knee Extension 45# 2x10    Cybex Knee Flexion 55# 2x10    Leg Press 60# 2x15    Other Lumbar Machine Exercise Seated row 45 lb 2x 15, lat pulldown 45 lb 2 sets x15     Lumbar Exercises: Standing   Other Standing Lumbar Exercises Overhead lubar ext yellow ball 2x10    Other Standing Lumbar Exercises Trunk  rotation with weigthed yellow ball in partial squat      Modalities   Modalities Moist Heat     Moist Heat Therapy   Number Minutes Moist Heat 10 Minutes   Moist Heat Location Lumbar Spine                  PT Short Term Goals - 08/08/16 1143      PT SHORT TERM GOAL #1   Title Pt will be independent with HEP.   Status Achieved           PT Long Term Goals - 09/16/16 1343      PT LONG TERM GOAL #1   Title Pt will report 50% decrease in worst pain rating.   Status Achieved     PT LONG TERM GOAL #2   Title Pt will improve HS 90/90 by 10 degrees in order to dmeonstrate increased flexibility.   Status Partially Met     PT LONG TERM GOAL #3   Title Pt will be able to stand and be on his feet for more than 10 minutes wihtout increased symptoms.  Status Achieved               Plan - 09/16/16 1339    Clinical Impression Statement Patient tolerated treatment well and increased resistance on all exercises with no issues. Patient was gone a week on vacation and had no issues with function.    Rehab Potential Good   PT Frequency 2x / week   PT Duration 8 weeks   PT Treatment/Interventions Cryotherapy;Electrical Stimulation;Iontophoresis '4mg'$ /ml Dexamethasone;Moist Heat;Ultrasound;Gait training;Stair training;Functional mobility training;Therapeutic activities;Therapeutic exercise;Balance training;Neuromuscular re-education;Patient/family education;Manual techniques;Passive range of motion   PT Next Visit Plan D/C PT       Patient will benefit from skilled therapeutic intervention in order to improve the following deficits and impairments:  Decreased activity tolerance, Decreased mobility, Decreased range of motion, Hypomobility, Impaired flexibility, Increased muscle spasms, Decreased endurance, Postural dysfunction, Obesity, Pain, Improper body mechanics  Visit Diagnosis: Muscle spasm of back  Chronic bilateral low back pain without sciatica  Chronic left  shoulder pain     Problem List Patient Active Problem List   Diagnosis Date Noted  . Spinal stenosis of lumbar region 07/12/2015    PHYSICAL THERAPY DISCHARGE SUMMARY  Visits from Start of Care: 10 Plan: Patient agrees to discharge.  Patient goals were partially met. Patient is being discharged due to meeting the stated rehab goals.  ?????    Devoria Albe, [place]  09/16/2016, 1:44 PM  [place] [address] [address] [place], [place], [address] Phone: [phone]   Fax:  [phone]  Name: Jason Kidd MRN: [id number] Date of Birth: 05-Aug-1951
